# Patient Record
Sex: Female | Born: 1935 | Race: Black or African American | Hispanic: No | Smoking: Never smoker
Health system: Southern US, Community
[De-identification: ages and names within clinical notes are randomized; demographics above are authoritative.]

## PROBLEM LIST (undated history)

## (undated) DIAGNOSIS — E785 Hyperlipidemia, unspecified: Secondary | ICD-10-CM

## (undated) DIAGNOSIS — E089 Diabetes mellitus due to underlying condition without complications: Secondary | ICD-10-CM

## (undated) DIAGNOSIS — E46 Unspecified protein-calorie malnutrition: Secondary | ICD-10-CM

## (undated) DIAGNOSIS — N189 Chronic kidney disease, unspecified: Secondary | ICD-10-CM

## (undated) DIAGNOSIS — I10 Essential (primary) hypertension: Secondary | ICD-10-CM

## (undated) DIAGNOSIS — T7840XA Allergy, unspecified, initial encounter: Secondary | ICD-10-CM

## (undated) DIAGNOSIS — R630 Anorexia: Secondary | ICD-10-CM

## (undated) DIAGNOSIS — M810 Age-related osteoporosis without current pathological fracture: Secondary | ICD-10-CM

## (undated) DIAGNOSIS — N2889 Other specified disorders of kidney and ureter: Secondary | ICD-10-CM

## (undated) DIAGNOSIS — F0390 Unspecified dementia without behavioral disturbance: Secondary | ICD-10-CM

## (undated) DIAGNOSIS — D649 Anemia, unspecified: Secondary | ICD-10-CM

## (undated) DIAGNOSIS — R413 Other amnesia: Secondary | ICD-10-CM

## (undated) HISTORY — DX: Hyperlipidemia, unspecified: E78.5

## (undated) HISTORY — DX: Essential (primary) hypertension: I10

## (undated) HISTORY — PX: HIP FRACTURE SURGERY: SHX118

## (undated) HISTORY — PX: WRIST SURGERY: SHX841

## (undated) HISTORY — DX: Other specified disorders of kidney and ureter: N28.89

## (undated) HISTORY — DX: Anemia, unspecified: D64.9

## (undated) HISTORY — DX: Unspecified protein-calorie malnutrition: E46

## (undated) HISTORY — PX: OTHER SURGICAL HISTORY: SHX169

## (undated) HISTORY — DX: Other amnesia: R41.3

## (undated) HISTORY — DX: Age-related osteoporosis without current pathological fracture: M81.0

## (undated) HISTORY — DX: Unspecified dementia, unspecified severity, without behavioral disturbance, psychotic disturbance, mood disturbance, and anxiety: F03.90

## (undated) HISTORY — PX: ABDOMINAL HYSTERECTOMY: SHX81

## (undated) HISTORY — DX: Chronic kidney disease, unspecified: N18.9

## (undated) HISTORY — DX: Allergy, unspecified, initial encounter: T78.40XA

## (undated) HISTORY — DX: Anorexia: R63.0

## (undated) HISTORY — DX: Diabetes mellitus due to underlying condition without complications: E08.9

---

## 2004-11-02 ENCOUNTER — Ambulatory Visit: Payer: Self-pay | Admitting: Dermatology

## 2005-12-01 ENCOUNTER — Ambulatory Visit: Payer: Self-pay | Admitting: Otolaryngology

## 2007-01-22 LAB — HM PAP SMEAR: HM Pap smear: NORMAL

## 2007-02-01 ENCOUNTER — Encounter: Payer: Self-pay | Admitting: Cardiovascular Disease

## 2007-03-08 ENCOUNTER — Ambulatory Visit: Payer: Self-pay | Admitting: Family Medicine

## 2008-05-21 ENCOUNTER — Ambulatory Visit: Payer: Self-pay | Admitting: Family Medicine

## 2009-06-22 ENCOUNTER — Encounter: Payer: Self-pay | Admitting: Cardiovascular Disease

## 2009-06-22 LAB — HM DEXA SCAN

## 2009-06-22 LAB — CONVERTED CEMR LAB
Albumin: 4.8 g/dL
BUN: 17 mg/dL
Chloride: 96 meq/L
Cholesterol: 214 mg/dL
Creatinine, Ser: 1.59 mg/dL
GFR calc Af Amer: 39 mL/min
GFR calc non Af Amer: 32 mL/min
Glucose, Bld: 96 mg/dL
HCT: 29.9 %
Hemoglobin: 13.1 g/dL
Lymphocytes, automated: 42 %
MCV: 91 fL
Platelets: 261 10*3/uL
Potassium: 4.3 meq/L
Total Protein: 7.8 g/dL

## 2009-06-30 ENCOUNTER — Encounter: Payer: Self-pay | Admitting: Cardiovascular Disease

## 2009-06-30 DIAGNOSIS — M1990 Unspecified osteoarthritis, unspecified site: Secondary | ICD-10-CM | POA: Insufficient documentation

## 2009-06-30 DIAGNOSIS — M810 Age-related osteoporosis without current pathological fracture: Secondary | ICD-10-CM | POA: Insufficient documentation

## 2009-06-30 DIAGNOSIS — M79609 Pain in unspecified limb: Secondary | ICD-10-CM

## 2009-07-15 ENCOUNTER — Ambulatory Visit: Payer: Self-pay | Admitting: Family Medicine

## 2009-07-16 ENCOUNTER — Telehealth: Payer: Self-pay | Admitting: Cardiovascular Disease

## 2009-07-20 ENCOUNTER — Ambulatory Visit: Payer: Self-pay | Admitting: Family Medicine

## 2010-01-06 ENCOUNTER — Ambulatory Visit: Payer: Self-pay | Admitting: General Surgery

## 2010-06-22 NOTE — Progress Notes (Signed)
Summary: appointment with tg  Phone Note Outgoing Call   Call placed by: Marchelle Folks Call placed to: Patient Summary of Call: called pt to set up follow up appointment, pt stated that she is not having any problems with her heart. didn't set up appointment. KL:) Initial call taken by: Mercer Pod,  July 16, 2009 7:33 AM

## 2010-06-22 NOTE — Progress Notes (Signed)
Summary: [facility]  [facility]   Imported By: Harlon Flor 07/15/2009 12:05:38  _____________________________________________________________________  External Attachment:    Type:   Image     Comment:   External Document

## 2010-07-19 ENCOUNTER — Ambulatory Visit: Payer: Self-pay | Admitting: General Surgery

## 2010-09-27 ENCOUNTER — Ambulatory Visit: Payer: Self-pay | Admitting: Family Medicine

## 2011-09-15 ENCOUNTER — Emergency Department: Payer: Self-pay | Admitting: Emergency Medicine

## 2011-09-15 LAB — CBC
HCT: 35.4 % (ref 35.0–47.0)
MCHC: 32.5 g/dL (ref 32.0–36.0)
MCV: 93 fL (ref 80–100)
Platelet: 285 10*3/uL (ref 150–440)
RBC: 3.81 10*6/uL (ref 3.80–5.20)
RDW: 12.9 % (ref 11.5–14.5)
WBC: 13.7 10*3/uL — ABNORMAL HIGH (ref 3.6–11.0)

## 2011-09-15 LAB — COMPREHENSIVE METABOLIC PANEL
Alkaline Phosphatase: 93 U/L (ref 50–136)
Anion Gap: 10 (ref 7–16)
Bilirubin,Total: 0.3 mg/dL (ref 0.2–1.0)
EGFR (Non-African Amer.): 41 — ABNORMAL LOW
Glucose: 134 mg/dL — ABNORMAL HIGH (ref 65–99)
SGOT(AST): 23 U/L (ref 15–37)
Sodium: 138 mmol/L (ref 136–145)
Total Protein: 8.1 g/dL (ref 6.4–8.2)

## 2011-09-16 LAB — URINALYSIS, COMPLETE
Bilirubin,UR: NEGATIVE
Glucose,UR: NEGATIVE mg/dL (ref 0–75)
Hyaline Cast: 4
Nitrite: NEGATIVE
Ph: 7 (ref 4.5–8.0)
RBC,UR: 10 /HPF (ref 0–5)
Specific Gravity: 1.011 (ref 1.003–1.030)
Squamous Epithelial: 2

## 2011-09-16 LAB — TROPONIN I: Troponin-I: 0.03 ng/mL

## 2012-11-21 ENCOUNTER — Encounter: Payer: Self-pay | Admitting: Nurse Practitioner

## 2012-11-22 ENCOUNTER — Encounter: Payer: Self-pay | Admitting: Nurse Practitioner

## 2012-11-22 ENCOUNTER — Ambulatory Visit (INDEPENDENT_AMBULATORY_CARE_PROVIDER_SITE_OTHER): Payer: Medicare PPO | Admitting: Nurse Practitioner

## 2012-11-22 VITALS — BP 150/80 | HR 68 | Ht 60.0 in | Wt 120.0 lb

## 2012-11-22 DIAGNOSIS — F0281 Dementia in other diseases classified elsewhere with behavioral disturbance: Secondary | ICD-10-CM

## 2012-11-22 DIAGNOSIS — F079 Unspecified personality and behavioral disorder due to known physiological condition: Secondary | ICD-10-CM

## 2012-11-22 MED ORDER — DONEPEZIL HCL 10 MG PO TABS: 10.0000 mg | ORAL_TABLET | Freq: Every day | ORAL | Status: DC

## 2012-11-22 MED ORDER — MEMANTINE HCL ER 28 MG PO CP24: 28.0000 mg | ORAL_CAPSULE | Freq: Every day | ORAL | Status: DC

## 2012-11-22 NOTE — Progress Notes (Signed)
HPI: Ms  Leugers is a 78 year old right-handed black female with a history of a memory disturbance. The patient lives alone, but the family helps her day to day and she has a paid caregiver 2 hours daily. The patient still operates a motor vehicle, driving short distances to church or to the grocery store to get a few items. The patient is monitored on a regular basis by the family. The patient has the pills placed into a pill dispenser, and the patient still cooks for herself. The family indicates that there have been no safety issues with this. The patient has her finances done by other family members. The patient is otherwise performing all of her activities of daily living on her own. The patient is on Aricept, and she is tolerating the medication. She is also taking Namenda without side effects No other new medical issues have come up since last seen. According to the daughter who is with her today her condition is stable.     ROS:  Memory loss  Physical Exam General: well developed, well nourished, seated, in no evident distress Head: head normocephalic and atraumatic. Oropharynx benign Neck: supple with no carotid  bruits Cardiovascular: regular rate and rhythm, no murmurs  Neurologic Exam Mental Status: Awake and  alert. MMSE 21/30 missing items in orientation, concentration, 2 of 3 recall and inability to copy a figure. AFT 5.  Follows all commands. Speech and language normal.   Cranial Nerves: Pupils equal, briskly reactive to light. Extraocular movements full without nystagmus. Visual fields full to confrontation. Hearing intact and symmetric to finger snap. Facial sensation intact. Face, tongue, palate move normally and symmetrically. Neck flexion and extension normal.  Motor: Normal bulk and tone. Normal strength in all tested extremity muscles.No focal weakness Sensory.: intact to touch and pinprick and vibratory.  Coordination: Rapid alternating movements normal in all extremities.  Finger-to-nose and heel-to-shin performed accurately bilaterally. No dysmetria Gait and Station: Arises from chair without difficulty. Stance is normal.  Able to heel, toe and tandem walk without difficulty.  Reflexes: 2+ and symmetric. Toes downgoing.     ASSESSMENT: Dementia currently on Aricept and Namenda with stable Mini-Mental Status exam score     PLAN: Pt to continue aricept will call in refills Patient to continue Namenda will call in refills Followup in 6 months Nilda Riggs, GNP-BC APRN

## 2012-11-22 NOTE — Patient Instructions (Addendum)
Pt to continue aricept will call in refills Patient to continue Namenda will call in refills Memory score is stable Followup in 6 months

## 2012-11-22 NOTE — Progress Notes (Signed)
I have read the note, and I agree with the clinical assessment and plan.  

## 2013-04-11 ENCOUNTER — Emergency Department: Payer: Self-pay | Admitting: Emergency Medicine

## 2013-04-11 LAB — URINALYSIS, COMPLETE
Blood: NEGATIVE
Glucose,UR: NEGATIVE mg/dL (ref 0–75)
Hyaline Cast: 3
Nitrite: NEGATIVE
Ph: 5 (ref 4.5–8.0)
Protein: NEGATIVE
Specific Gravity: 1.01 (ref 1.003–1.030)
Squamous Epithelial: 1
WBC UR: 5 /HPF (ref 0–5)

## 2013-04-11 LAB — CBC WITH DIFFERENTIAL/PLATELET
Basophil %: 0.9 %
Eosinophil #: 0 10*3/uL (ref 0.0–0.7)
HCT: 38.4 % (ref 35.0–47.0)
Lymphocyte %: 31.3 %
MCH: 30.5 pg (ref 26.0–34.0)
MCHC: 33.7 g/dL (ref 32.0–36.0)
Monocyte %: 8.7 %
Neutrophil #: 4.4 10*3/uL (ref 1.4–6.5)
Neutrophil %: 58.5 %

## 2013-04-11 LAB — BASIC METABOLIC PANEL
Anion Gap: 7 (ref 7–16)
Co2: 28 mmol/L (ref 21–32)
Creatinine: 2.07 mg/dL — ABNORMAL HIGH (ref 0.60–1.30)
Potassium: 4.2 mmol/L (ref 3.5–5.1)
Sodium: 135 mmol/L — ABNORMAL LOW (ref 136–145)

## 2013-05-27 ENCOUNTER — Ambulatory Visit: Payer: Medicare PPO | Admitting: Nurse Practitioner

## 2013-10-21 ENCOUNTER — Other Ambulatory Visit: Payer: Self-pay | Admitting: Nurse Practitioner

## 2013-11-28 ENCOUNTER — Ambulatory Visit: Payer: Medicare PPO | Admitting: Nurse Practitioner

## 2013-11-28 ENCOUNTER — Telehealth: Payer: Self-pay | Admitting: Nurse Practitioner

## 2013-11-28 NOTE — Telephone Encounter (Signed)
No show for scheduled appt 

## 2013-12-05 LAB — LIPID PANEL
Cholesterol: 201 mg/dL — AB (ref 0–200)
HDL: 76 mg/dL — AB (ref 35–70)
LDL CALC: 110 mg/dL
TRIGLYCERIDES: 77 mg/dL (ref 40–160)

## 2014-01-22 ENCOUNTER — Ambulatory Visit (INDEPENDENT_AMBULATORY_CARE_PROVIDER_SITE_OTHER): Payer: Medicare PPO | Admitting: Nurse Practitioner

## 2014-01-22 ENCOUNTER — Encounter: Payer: Self-pay | Admitting: Nurse Practitioner

## 2014-01-22 VITALS — BP 146/82 | HR 59 | Ht 60.0 in | Wt 116.0 lb

## 2014-01-22 DIAGNOSIS — F079 Unspecified personality and behavioral disorder due to known physiological condition: Secondary | ICD-10-CM

## 2014-01-22 DIAGNOSIS — F0281 Dementia in other diseases classified elsewhere with behavioral disturbance: Secondary | ICD-10-CM

## 2014-01-22 DIAGNOSIS — F02818 Dementia in other diseases classified elsewhere, unspecified severity, with other behavioral disturbance: Secondary | ICD-10-CM

## 2014-01-22 MED ORDER — DONEPEZIL HCL 10 MG PO TABS: 10.0000 mg | ORAL_TABLET | Freq: Every day | ORAL | Status: DC

## 2014-01-22 MED ORDER — MEMANTINE HCL ER 28 MG PO CP24: 28.0000 mg | ORAL_CAPSULE | Freq: Every day | ORAL | Status: DC

## 2014-01-22 NOTE — Progress Notes (Signed)
I have read the note, and I agree with the clinical assessment and plan.  WILLIS,CHARLES KEITH   

## 2014-01-22 NOTE — Patient Instructions (Signed)
Continued Aricept will refill Continue Namenda will refill Followup in 6 months

## 2014-01-22 NOTE — Progress Notes (Signed)
[facility]  PATIENT: Tracy Sawyer DOB: Jun 07, 1934   REASON FOR VISIT: Followup for memory loss   HISTORY OF PRESENT ILLNESS:Ms Ganesh is a 79 year old right-handed black female with a history of a memory disturbance. The patient lives alone, but the family helps her day to day and she has a paid caregiver 2 hours daily. The patient does not  operate a motor vehicle.The patient is monitored on a regular basis by the family. The patient has the pills placed into a pill dispenser, and the patient still cooks for herself by warming things in the microwave.  The family indicates that there have been no safety issues with this. The patient has her finances done by other family members. The patient is otherwise performing all of her activities of daily living on her own. The patient is on Aricept, and she is tolerating the medication. She is also taking Namenda without side effects No other new medical issues have come up since last seen. According to the daughter who is with her today her condition is stable. She returns for reevaluation. She needs refills on her medications.  REVIEW OF SYSTEMS: Full 14 system review of systems performed and notable only for those listed, all others are neg:  Constitutional: N/A  Cardiovascular: N/A  Ear/Nose/Throat: N/A  Skin: N/A  Eyes: N/A  Respiratory: Cough Gastroitestinal: N/A  Hematology/Lymphatic: N/A  Endocrine: N/A Musculoskeletal:N/A  Allergy/Immunology: N/A  Neurological: Memory loss  Psychiatric: N/A Sleep : NA   ALLERGIES: Allergies  Allergen Reactions  . Aspirin Adult Low [Aspirin]   . Fosamax [Alendronate Sodium]   . Penicillins     HOME MEDICATIONS: Outpatient Prescriptions Prior to Visit  Medication Sig Dispense Refill  . amLODipine-benazepril (LOTREL) 5-20 MG per capsule Take 1 capsule by mouth daily.      Marland Kitchen donepezil (ARICEPT) 10 MG tablet TAKE 1 TABLET BY MOUTH EVERY DAY  30 tablet  0  . Memantine  HCl ER (NAMENDA XR) 28 MG CP24 Take 28 mg by mouth daily.  30 capsule  6  . Multiple Vitamin (MULTIVITAMIN) tablet Take 1 tablet by mouth daily.       No facility-administered medications prior to visit.    PAST MEDICAL HISTORY: Past Medical History  Diagnosis Date  . Dementia   . Hypertension   . Memory loss     PAST SURGICAL HISTORY: Past Surgical History  Procedure Laterality Date  . Cataract surgery Right   . Vocal cord nodule resection    . Wrist surgery    . Ganglion cyst resection Left     FAMILY HISTORY: Family History  Problem Relation Age of Onset  . Dementia Mother   . Stroke Mother   . Diabetes Father   . Peripheral vascular disease Father   . Cancer      1 sibling  . Cancer      1 sibling    SOCIAL HISTORY: History   Social History  . Marital Status: Unknown    Spouse Name: N/A    Number of Children: 2  . Years of Education: N/A   Occupational History  . Retired     Social History Main Topics  . Smoking status: Never Smoker   . Smokeless tobacco: Never Used  . Alcohol Use: No  . Drug Use: No  . Sexual Activity: Not on file   Other Topics Concern  . Not on file   Social History Narrative   Patient is widowed.   Patient lives  alone.   Patient is retired from working in [employer] school system.    Patient has a high school education.    Patient has 2 children.   Patient has 11 brothers and 6 sisters. 2 siblings passed away of cancer.    Several siblings have diabetes and multiple siblings had hypertension and peripheral vascular disease runs in the family.            PHYSICAL EXAM  Filed Vitals:   01/22/14 1351  BP: 146/82  Pulse: 59  Height: 5' (1.524 m)  Weight: 116 lb (52.617 kg)   Body mass index is 22.65 kg/(m^2). General: well developed, well nourished, seated, in no evident distress  Head: head normocephalic and atraumatic. Oropharynx benign  Neck: supple with no carotid bruits  Cardiovascular: regular rate and  rhythm, no murmurs  Neurologic Exam  Mental Status: Awake and alert. MMSE 16/30 missing items in orientation, concentration, 3 of 3 recall and inability to copy a figure. AFT 4. Follows all commands. Speech and language normal.  Cranial Nerves: Pupils equal, briskly reactive to light. Extraocular movements full without nystagmus. Visual fields full to confrontation. Hearing intact and symmetric to finger snap. Facial sensation intact. Face, tongue, palate move normally and symmetrically. Neck flexion and extension normal.  Motor: Normal bulk and tone. Normal strength in all tested extremity muscles.No focal weakness  Sensory.: intact to touch and pinprick and vibratory.  Coordination: Rapid alternating movements normal in all extremities. Finger-to-nose and heel-to-shin performed accurately bilaterally. No dysmetria  Gait and Station: Arises from chair without difficulty. Stance is normal. Able to heel, toe and tandem walk without difficulty.  Reflexes: 2+ and symmetric. Toes downgoing.     DIAGNOSTIC DATA (LABS, IMAGING, TESTING) - ASSESSMENT AND PLAN  79 y.o. year old female  has a past medical history of Dementia; Hypertension; and Memory loss. here in followup.  Continued Aricept will refill Continue Namenda will refill Followup in 6 months Nilda Riggs, [facility], [facility], APRN  [facility] [address] [place], [place] [address] [phone]

## 2014-04-09 ENCOUNTER — Encounter: Payer: Self-pay | Admitting: Neurology

## 2014-04-15 ENCOUNTER — Encounter: Payer: Self-pay | Admitting: Neurology

## 2014-05-18 LAB — COMPREHENSIVE METABOLIC PANEL
AST: 35 U/L (ref 15–37)
Albumin: 3.8 g/dL (ref 3.4–5.0)
Alkaline Phosphatase: 73 U/L
Anion Gap: 8 (ref 7–16)
BILIRUBIN TOTAL: 0.4 mg/dL (ref 0.2–1.0)
BUN: 39 mg/dL — ABNORMAL HIGH (ref 7–18)
CO2: 28 mmol/L (ref 21–32)
Calcium, Total: 9 mg/dL (ref 8.5–10.1)
Chloride: 105 mmol/L (ref 98–107)
Creatinine: 1.92 mg/dL — ABNORMAL HIGH (ref 0.60–1.30)
EGFR (African American): 33 — ABNORMAL LOW
GFR CALC NON AF AMER: 27 — AB
Glucose: 100 mg/dL — ABNORMAL HIGH (ref 65–99)
OSMOLALITY: 291 (ref 275–301)
Potassium: 3.9 mmol/L (ref 3.5–5.1)
SGPT (ALT): 40 U/L
Sodium: 141 mmol/L (ref 136–145)
Total Protein: 7.1 g/dL (ref 6.4–8.2)

## 2014-05-18 LAB — CBC
HCT: 31.9 % — AB (ref 35.0–47.0)
HGB: 10.4 g/dL — ABNORMAL LOW (ref 12.0–16.0)
MCH: 30.5 pg (ref 26.0–34.0)
MCHC: 32.5 g/dL (ref 32.0–36.0)
MCV: 94 fL (ref 80–100)
Platelet: 314 10*3/uL (ref 150–440)
RBC: 3.41 10*6/uL — ABNORMAL LOW (ref 3.80–5.20)
RDW: 12.7 % (ref 11.5–14.5)
WBC: 16.8 10*3/uL — ABNORMAL HIGH (ref 3.6–11.0)

## 2014-05-19 ENCOUNTER — Inpatient Hospital Stay: Payer: Self-pay | Admitting: Internal Medicine

## 2014-05-19 DIAGNOSIS — S72009A Fracture of unspecified part of neck of unspecified femur, initial encounter for closed fracture: Secondary | ICD-10-CM | POA: Insufficient documentation

## 2014-05-19 LAB — URINALYSIS, COMPLETE
BACTERIA: NONE SEEN
BILIRUBIN, UR: NEGATIVE
BLOOD: NEGATIVE
Glucose,UR: NEGATIVE mg/dL (ref 0–75)
KETONE: NEGATIVE
Leukocyte Esterase: NEGATIVE
Nitrite: NEGATIVE
Ph: 6 (ref 4.5–8.0)
Protein: NEGATIVE
RBC,UR: 10 /HPF (ref 0–5)
Specific Gravity: 1.015 (ref 1.003–1.030)
Squamous Epithelial: <1
WBC UR: 2 /HPF (ref 0–5)

## 2014-05-20 DIAGNOSIS — Z01810 Encounter for preprocedural cardiovascular examination: Secondary | ICD-10-CM

## 2014-05-20 DIAGNOSIS — I10 Essential (primary) hypertension: Secondary | ICD-10-CM

## 2014-05-20 LAB — COMPREHENSIVE METABOLIC PANEL
ALT: 25 U/L
Albumin: 3 g/dL — ABNORMAL LOW (ref 3.4–5.0)
Alkaline Phosphatase: 62 U/L
Anion Gap: 9 (ref 7–16)
BUN: 23 mg/dL — ABNORMAL HIGH (ref 7–18)
Bilirubin,Total: 0.5 mg/dL (ref 0.2–1.0)
CREATININE: 1.84 mg/dL — AB (ref 0.60–1.30)
Calcium, Total: 8.1 mg/dL — ABNORMAL LOW (ref 8.5–10.1)
Chloride: 101 mmol/L (ref 98–107)
Co2: 25 mmol/L (ref 21–32)
EGFR (African American): 34 — ABNORMAL LOW
EGFR (Non-African Amer.): 28 — ABNORMAL LOW
Glucose: 156 mg/dL — ABNORMAL HIGH (ref 65–99)
OSMOLALITY: 277 (ref 275–301)
Potassium: 3.8 mmol/L (ref 3.5–5.1)
SGOT(AST): 19 U/L (ref 15–37)
Sodium: 135 mmol/L — ABNORMAL LOW (ref 136–145)
TOTAL PROTEIN: 6.1 g/dL — AB (ref 6.4–8.2)

## 2014-05-20 LAB — BASIC METABOLIC PANEL
Anion Gap: 6 — ABNORMAL LOW (ref 7–16)
BUN: 22 mg/dL — ABNORMAL HIGH (ref 7–18)
CHLORIDE: 104 mmol/L (ref 98–107)
Calcium, Total: 8.7 mg/dL (ref 8.5–10.1)
Co2: 27 mmol/L (ref 21–32)
Creatinine: 1.6 mg/dL — ABNORMAL HIGH (ref 0.60–1.30)
GFR CALC AF AMER: 40 — AB
GFR CALC NON AF AMER: 33 — AB
Glucose: 100 mg/dL — ABNORMAL HIGH (ref 65–99)
Osmolality: 277 (ref 275–301)
POTASSIUM: 4.1 mmol/L (ref 3.5–5.1)
SODIUM: 137 mmol/L (ref 136–145)

## 2014-05-20 LAB — CBC WITH DIFFERENTIAL/PLATELET
BASOS ABS: 0 10*3/uL (ref 0.0–0.1)
BASOS PCT: 0.5 %
Basophil #: 0 10*3/uL (ref 0.0–0.1)
Basophil %: 0.6 %
EOS ABS: 0.1 10*3/uL (ref 0.0–0.7)
Eosinophil #: 0.1 10*3/uL (ref 0.0–0.7)
Eosinophil %: 0.8 %
Eosinophil %: 1.1 %
HCT: 29.6 % — ABNORMAL LOW (ref 35.0–47.0)
HCT: 29 % — ABNORMAL LOW (ref 35.0–47.0)
HGB: 9.6 g/dL — ABNORMAL LOW (ref 12.0–16.0)
HGB: 9.5 g/dL — ABNORMAL LOW (ref 12.0–16.0)
LYMPHS ABS: 2.2 10*3/uL (ref 1.0–3.6)
LYMPHS PCT: 15.5 %
LYMPHS PCT: 27.1 %
Lymphocyte #: 1.4 10*3/uL (ref 1.0–3.6)
MCH: 30.7 pg (ref 26.0–34.0)
MCH: 30.6 pg (ref 26.0–34.0)
MCHC: 32.5 g/dL (ref 32.0–36.0)
MCHC: 32.8 g/dL (ref 32.0–36.0)
MCV: 95 fL (ref 80–100)
MCV: 93 fL (ref 80–100)
MONO ABS: 0.9 x10 3/mm (ref 0.2–0.9)
Monocyte #: 1 x10 3/mm — ABNORMAL HIGH (ref 0.2–0.9)
Monocyte %: 9.8 %
Monocyte %: 12.4 %
NEUTROS ABS: 6.8 10*3/uL — AB (ref 1.4–6.5)
NEUTROS ABS: 4.9 10*3/uL (ref 1.4–6.5)
NEUTROS PCT: 73.4 %
NEUTROS PCT: 58.8 %
Platelet: 219 10*3/uL (ref 150–440)
Platelet: 202 10*3/uL (ref 150–440)
RBC: 3.14 10*6/uL — ABNORMAL LOW (ref 3.80–5.20)
RBC: 3.11 10*6/uL — ABNORMAL LOW (ref 3.80–5.20)
RDW: 12.7 % (ref 11.5–14.5)
RDW: 12.8 % (ref 11.5–14.5)
WBC: 9.2 10*3/uL (ref 3.6–11.0)
WBC: 8.3 10*3/uL (ref 3.6–11.0)

## 2014-05-21 ENCOUNTER — Encounter: Payer: Self-pay | Admitting: Internal Medicine

## 2014-05-21 LAB — BASIC METABOLIC PANEL
ANION GAP: 8 (ref 7–16)
BUN: 17 mg/dL (ref 7–18)
CALCIUM: 8.8 mg/dL (ref 8.5–10.1)
Chloride: 105 mmol/L (ref 98–107)
Co2: 28 mmol/L (ref 21–32)
Creatinine: 1.49 mg/dL — ABNORMAL HIGH (ref 0.60–1.30)
EGFR (African American): 44 — ABNORMAL LOW
EGFR (Non-African Amer.): 36 — ABNORMAL LOW
Glucose: 91 mg/dL (ref 65–99)
Osmolality: 282 (ref 275–301)
Potassium: 3.9 mmol/L (ref 3.5–5.1)
Sodium: 141 mmol/L (ref 136–145)

## 2014-05-21 LAB — HEMOGLOBIN: HGB: 10.4 g/dL — ABNORMAL LOW (ref 12.0–16.0)

## 2014-05-22 LAB — CBC WITH DIFFERENTIAL/PLATELET
BASOS ABS: 0.1 10*3/uL (ref 0.0–0.1)
Basophil %: 0.5 %
Eosinophil #: 0.1 10*3/uL (ref 0.0–0.7)
Eosinophil %: 1 %
HCT: 29.3 % — ABNORMAL LOW (ref 35.0–47.0)
HGB: 9.6 g/dL — ABNORMAL LOW (ref 12.0–16.0)
LYMPHS ABS: 1.5 10*3/uL (ref 1.0–3.6)
Lymphocyte %: 14.7 %
MCH: 30.6 pg (ref 26.0–34.0)
MCHC: 32.9 g/dL (ref 32.0–36.0)
MCV: 93 fL (ref 80–100)
Monocyte #: 1.1 x10 3/mm — ABNORMAL HIGH (ref 0.2–0.9)
Monocyte %: 11.5 %
NEUTROS ABS: 7.2 10*3/uL — AB (ref 1.4–6.5)
NEUTROS PCT: 72.3 %
Platelet: 223 10*3/uL (ref 150–440)
RBC: 3.14 10*6/uL — ABNORMAL LOW (ref 3.80–5.20)
RDW: 12.6 % (ref 11.5–14.5)
WBC: 10 10*3/uL (ref 3.6–11.0)

## 2014-05-22 LAB — BASIC METABOLIC PANEL
ANION GAP: 7 (ref 7–16)
BUN: 15 mg/dL (ref 7–18)
CALCIUM: 8.4 mg/dL — AB (ref 8.5–10.1)
CREATININE: 1.66 mg/dL — AB (ref 0.60–1.30)
Chloride: 104 mmol/L (ref 98–107)
Co2: 27 mmol/L (ref 21–32)
EGFR (African American): 39 — ABNORMAL LOW
GFR CALC NON AF AMER: 32 — AB
GLUCOSE: 80 mg/dL (ref 65–99)
Osmolality: 275 (ref 275–301)
POTASSIUM: 3.8 mmol/L (ref 3.5–5.1)
SODIUM: 138 mmol/L (ref 136–145)

## 2014-05-23 ENCOUNTER — Encounter: Payer: Self-pay | Admitting: Internal Medicine

## 2014-07-23 ENCOUNTER — Ambulatory Visit (INDEPENDENT_AMBULATORY_CARE_PROVIDER_SITE_OTHER): Payer: Medicare PPO | Admitting: Nurse Practitioner

## 2014-07-23 ENCOUNTER — Encounter: Payer: Self-pay | Admitting: Nurse Practitioner

## 2014-07-23 VITALS — BP 133/82 | HR 83 | Ht 61.0 in | Wt 102.6 lb

## 2014-07-23 DIAGNOSIS — F0281 Dementia in other diseases classified elsewhere with behavioral disturbance: Secondary | ICD-10-CM

## 2014-07-23 DIAGNOSIS — F02818 Dementia in other diseases classified elsewhere, unspecified severity, with other behavioral disturbance: Secondary | ICD-10-CM

## 2014-07-23 DIAGNOSIS — R413 Other amnesia: Secondary | ICD-10-CM | POA: Diagnosis not present

## 2014-07-23 DIAGNOSIS — F079 Unspecified personality and behavioral disorder due to known physiological condition: Secondary | ICD-10-CM

## 2014-07-23 MED ORDER — MEMANTINE HCL ER 28 MG PO CP24: 28.0000 mg | ORAL_CAPSULE | Freq: Every day | ORAL | Status: DC

## 2014-07-23 MED ORDER — DONEPEZIL HCL 10 MG PO TABS: 10.0000 mg | ORAL_TABLET | Freq: Every day | ORAL | Status: DC

## 2014-07-23 NOTE — Progress Notes (Signed)
I have read the note, and I agree with the clinical assessment and plan.  WILLIS,CHARLES KEITH   

## 2014-07-23 NOTE — Patient Instructions (Signed)
Continue Aricept at current dose will refill Continue Namenda at current dose will refill F/U in 6 months next appt with Dr Anne HahnWillis

## 2014-07-23 NOTE — Progress Notes (Signed)
[facility]  PATIENT: Tracy Sawyer DOB: 04/09/1935   REASON FOR VISIT: follow up for dementia HISTORY FROM:daughter and son in law    HISTORY OF PRESENT ILLNESS:Tracy Sawyer is a 80 year old right-handed black female with a history of a memory disturbance. The patient lives alone, but the family helps her day to day and she has a paid caregiver 10 hours daily. The patient does not operate a motor vehicle.She fell at home in December 2015 and fractured left hip and right pelvis. She spent some time in rehab. Daughter states she has been home from rehabilitation, she no longer wanders at night but sleeps well. She has lost about 20 pounds since last seen. The patient has the pills placed into a pill dispenser. She no longer cooks. The family indicates that there have been no safety issues identified . The patient has her finances done by other family members. The patient is requiring assistance with  activities of daily living. The patient is on Aricept, and she is tolerating the medication. She is also taking Namenda without side effects No other new medical issues have come up since last seen. According to the daughter who is with her today her condition is stable. She returns for reevaluation. She needs refills on her medications.   REVIEW OF SYSTEMS: Full 14 system review of systems performed and notable only for those listed, all others are neg:  Constitutional: neg  Cardiovascular: neg Ear/Nose/Throat: neg  Skin: neg Eyes: neg Respiratory: Cough  Gastroitestinal: neg  Hematology/Lymphatic: neg  Endocrine: neg Musculoskeletal: Walking difficulty Allergy/Immunology: neg Neurological: Memory loss Psychiatric: Confusion  Sleep : neg   ALLERGIES: Allergies  Allergen Reactions  . Aspirin Adult Low [Aspirin]   . Fosamax [Alendronate Sodium]   . Alendronate Other (See Comments)  . Other Other (See Comments)  . Penicillins     HOME  MEDICATIONS: Outpatient Prescriptions Prior to Visit  Medication Sig Dispense Refill  . amLODipine-benazepril (LOTREL) 5-20 MG per capsule Take 1 capsule by mouth daily.    . Multiple Vitamin (MULTIVITAMIN) tablet Take 1 tablet by mouth daily.    Marland Kitchen. donepezil (ARICEPT) 10 MG tablet Take 1 tablet (10 mg total) by mouth daily. 30 tablet 6  . Memantine HCl ER (NAMENDA XR) 28 MG CP24 Take 28 mg by mouth daily. 30 capsule 6   No facility-administered medications prior to visit.    PAST MEDICAL HISTORY: Past Medical History  Diagnosis Date  . Dementia   . Hypertension   . Memory loss     PAST SURGICAL HISTORY: Past Surgical History  Procedure Laterality Date  . Cataract surgery Right   . Vocal cord nodule resection    . Wrist surgery    . Ganglion cyst resection Left   . Hip fracture surgery      FAMILY HISTORY: Family History  Problem Relation Age of Onset  . Dementia Mother   . Stroke Mother   . Diabetes Father   . Peripheral vascular disease Father   . Cancer      1 sibling  . Cancer      1 sibling    SOCIAL HISTORY: History   Social History  . Marital Status: Widowed    Spouse Name: N/A  . Number of Children: 2  . Years of Education: N/A   Occupational History  . Retired     Social History Main Topics  . Smoking status: Never Smoker   . Smokeless tobacco: Never Used  . Alcohol  Use: No  . Drug Use: No  . Sexual Activity: Not on file   Other Topics Concern  . Not on file   Social History Narrative   Patient is widowed.   Patient lives alone.   Patient is retired from working in [employer] school system.    Patient has a high school education.    Patient has 2 children.   Patient has 11 brothers and 6 sisters. 2 siblings passed away of cancer.    Several siblings have diabetes and multiple siblings had hypertension and peripheral vascular disease runs in the family.            PHYSICAL EXAM  Filed Vitals:   07/23/14 0813  BP: 133/82  Pulse:  83  Height:  (1.549 m)  Weight: 102 lb 9.6 oz (46.539 kg)   Body mass index is 19.4 kg/(m^2). General: frail female  seated, in no evident distress  Head: head normocephalic and atraumatic. Oropharynx benign  Neck: supple with no carotid bruits  Cardiovascular: regular rate and rhythm, no murmurs  Neurologic Exam  Mental Status: Awake and alert. MMSE 9/30. Last was 16/30. She missed items in orientation, concentration, 3 of 3 recall, comprehension and inability to copy a figure. AFT 2. Follows some  commands. Speech and language normal.  Cranial Nerves: Pupils equal, briskly reactive to light. Extraocular movements full without nystagmus. Visual fields full to confrontation. Hearing intact and symmetric to finger snap. Facial sensation intact. Face, tongue, palate move normally and symmetrically. Neck flexion and extension normal.  Motor: Normal bulk and tone. Normal strength in all tested extremity muscles.No focal weakness  Sensory.: intact to touch and pinprick and vibratory.  Coordination: Rapid alternating movements normal in all extremities. Finger-to-nose and heel-to-shin performed with dysmetria noted Gait and Station: Arises from chair without difficulty. Stance is wide based. Gait is mildly unsteady. No assistive device  Reflexes: 2+ and symmetric. Toes downgoing.    DIAGNOSTIC DATA (LABS, IMAGING, TESTING) -  ASSESSMENT AND PLAN  80 y.o. year old female  has a past medical history of Dementia; Hypertension; and Memory loss. here to follow-up. She had a recent fall with fracture and pelvic fracture and spent time in rehabilitation. Her memory loss is stable her daughter however her MMSE has declined since last seen. She has 24/ 7 care provided by either family or paid  Caregivers. No safety issues identified  Continue Aricept at current dose will refill Continue Namenda at current dose will refill Discussed safety measures in the home, night lights, grab bars  etc F/U in 6 months next appt with Dr Anne Hahn Nilda Riggs, [facility], [facility], APRN  [facility] [address] [place], [place] [address] [phone]

## 2014-09-13 NOTE — H&P (Signed)
PATIENT NAME:  Tracy Sawyer, Tracy Sawyer MR#:  [id number] DATE OF BIRTH:  [birth year]  DATE OF ADMISSION:  05/19/2014  REFERRING PHYSICIAN: Cecille AmsterdamJonathan E. Mayford KnifeWilliams, MD  PRIMARY CARE PHYSICIAN: Dennison MascotLemont Morrisey, MD  ADMITTING PHYSICIAN: Crissie FiguresEdavally N. Jakia Kennebrew, MD   CHIEF COMPLAINT: Left lower extremity pain.   HISTORY OF PRESENT ILLNESS: A 80 year old African American female with a past medical history of hypertension, chronic kidney disease stage III, history of dementia, vitamin D deficiency, who presents to the Emergency Room with complaints of left lower extremity pain following a mechanical fall that she sustained last evening. According to the patient's daughter, who is with the patient at this time, stated that she just fell down due to tripping and sustained injury to the left lower extremity, and was not able to get up because of the pain. Hence, EMS was called, who brought the patient to the Emergency Room for further evaluation. The patient stated that she was doing fine and she does not exactly remember what caused her fall, but denies any loss of consciousness. No history of chest pain. No shortness of breath. No dizziness. No recent fevers. No nausea. No vomiting. No diarrhea. No diaphoresis. In the Emergency Room, the patient was evaluated by the ED physician and was found to be with stable vitals. X-ray of the left hip and the pelvis revealed a fracture of the left femur neck also fracture of the right superior pubic ramus, which was also confirmed by the CT of the pelvis which was obtained in the Emergency Room. Orthopedic surgeon on-call was consulted by the ED physician and according orthopedic, the patient was advised admission to the medical floor with possible surgery in the morning, to be followed up by orthopedic surgeon. She received some pain medications, following which her pain is under control and denies any complaints at this time. She is comfortably resting in the bed at this time.   PAST  MEDICAL HISTORY:  1.  Hypertension.  2.  Chronic kidney disease stage III.  3.  Dementia.  4.  Vitamin D deficiency.   PAST SURGICAL HISTORY: Hysterectomy.   ALLERGIES: PENICILLIN.   HOME MEDICATIONS:  1.  Aricept 10 mg tablet 1 tablet orally once a day.  2.  Hydrochlorothiazide 12.5 mg tablet 1 tablet orally once a day.  3.  Lotrel 10/40 mg tablet 1 tablet orally once a day.  4.  Mirtazapine 15 mg tablet 1 tablet orally once a day in the evening.  5.  Multivitamin 1 tablet orally once a day.  6.  Namenda XR 28 mg oral capsule extended release 1 capsule orally once a day.  7.  Triamcinolone topical cream apply as needed for rash.  8.  Tylenol Extra Strength 500 mg tablet 1 tablet orally 3 times a day.  9.  Vitamin D3 at 2000 international units oral capsule 1 capsule orally once a day.  10.  Voltaren topical 1% topical gel apply 4 grams to the left shoulder 4 times a day.   FAMILY HISTORY: Significant for hypertension and diabetes mellitus, which runs in family members.   SOCIAL HISTORY: She is single, lives alone. The patient was ambulatory at home without any support. No history of any alcohol usage, substance usage, or smoking.   REVIEW OF SYSTEMS:  CONSTITUTIONAL: Negative for fever, fatigue, or generalized weakness.  EYES: Negative for blurred vision, double vision. No pain. No redness. No inflammation.  EARS, NOSE, AND THROAT: Negative for tinnitus, ear pain, hearing loss, epistaxis, nasal discharge, or  difficulty swallowing.  RESPIRATORY: Negative for cough, wheezing, hemoptysis, dyspnea, painful respirations.  CARDIOVASCULAR: Negative for chest pain, orthopnea, pedal edema, palpitations, dizziness, or loss of consciousness.  GASTROINTESTINAL: Negative for nausea, vomiting, diarrhea, abdominal pain, hematemesis, melena, or GERD symptoms.  GENITOURINARY: Negative for dysuria, hematuria, frequency, or urgency.  ENDOCRINE: Negative for polyuria or nocturia. No heat or cold  intolerance.  HEMATOLOGIC: Negative for anemia, easy bruising, bleeding. INTEGUMENTARY: Negative for acne, skin rash, or lesions.  MUSCULOSKELETAL: Positive for left lower extremity pain following mechanical fall, which happened yesterday. History of chronic right shoulder pain, for which she takes p.r.n. pain  medications.  NEUROLOGICAL: Negative for focal weakness or numbness. No history of CVA, TIA. No history of seizure disorder. She does have a history of dementia, for which she takes medications and she is pretty much able to take care of herself at home.  PSYCHIATRIC: Negative for anxiety, insomnia, or depression.   PHYSICAL EXAMINATION:  VITAL SIGNS: Temperature 97.7 degrees Fahrenheit, pulse rate 90 per minute, respirations 18 per minute, blood pressure 151/98, current blood pressure 139/63, O2 saturation is 97% on room air.  GENERAL: A well-developed, well-nourished, pleasant lady, alert, awake, and oriented, in no acute distress, comfortably lying in the bed.  HEAD: Atraumatic, normocephalic.  EYES: Pupils are equal, react to light and accommodation. No conjunctival pallor. No scleral icterus. Extraocular movements intact.  NOSE: No nasal lesions. No drainage.  EARS: No drainage. No external lesions.  ORAL CAVITY: No mucosal lesions. No exudates.  NECK: Supple. No JVD. No thyromegaly. No carotid bruit. Range of motion of neck normal.  RESPIRATORY: Good respiratory effort. Bilateral vesicular breath sounds. No rales or rhonchi. Not using accessory muscles of respiration.  CARDIOVASCULAR: S1, S2 regular. No murmurs, gallops, or clicks appreciated. Peripheral pulses equal at carotid, femoral, and pedal pulses. No peripheral edema.  GASTROINTESTINAL: Abdomen is soft and nontender. No hepatosplenomegaly. Bowel sounds present and equal in all 4 quadrants. No guarding. No rebound. No rigidity.  GENITOURINARY: Deferred.  MUSCULOSKELETAL: Able to move both lower extremities and upper  extremities, but limited movement of the left lower extremity secondary to the fracture sustained secondary to fall.  SKIN: Inspection is within normal limits.  LYMPHATIC: Negative for cervical lymphadenopathy.  VASCULAR: Good dorsalis pedis and posterior tibial pulses.  NEUROLOGICAL: Alert, awake, and oriented x 3. Cranial nerves II to XII are grossly intact. DTRs 2+ and bilateral symmetrical. Strength 4/4 in both upper and lower extremities.  PSYCHIATRIC: Judgment and insight are adequate. Alert and oriented x 3. History of dementia, stable at this time.   ANCILLARY DATA: Labs: Serum glucose 100, BUN 39, creatinine 1.92, serum sodium 141, potassium 3.9, chloride 105, bicarbonate 28, total calcium 9.0, total protein 7.1, serum albumin 3.8, total bilirubin 0.4, alkaline phosphatase 73, AST 35, ALT 40. WBC 16.8, hemoglobin 10.4, hematocrit 31.9, platelet count 314,000.   X-ray of the left hip:  1.  Cannot exclude a nondisplaced left femoral neck fracture.  2.  Nondisplaced right superior pubic ramus fracture.   CT of the pelvis without contrast:  1.  Nondisplaced subcapital left femoral neck fracture.  2.  Right superior pubic ramus fracture with small extraperitoneal pelvic hematoma.   EKG: Normal sinus rhythm with ventricular rate of 95 beats per minute, nonspecific T wave abnormality, otherwise no acute ST-T changes.   ASSESSMENT AND PLAN: A 80 year old [place] American female with a past medical history significant for hypertension, dementia, chronic kidney disease stage III, vitamin D deficiency, presents to the Emergency  Room with complaints of left lower extremity pain following a mechanical fall and diagnosed to have fracture of the left femur neck and right superior pubic ramus fracture.   1.  Fractured left femur neck, nondisplaced, secondary to mechanical fall. Emergency Department physician consulted orthopedic on-call and advised admission to the medical floor and possible surgery in  a.Sawyer., to be followed by orthopedist on call. Plan: Admit to medical floor. Pain control measures and orthopedics consultation. Physical therapy and occupational therapy consultation requested.  2.  Fracture, right superior pubic ramus secondary to fall. Plan: Admit to medical floor. Pain control measures. Orthopedics consult, physical therapy and occupational therapy consultation.  3.  Hypertension, stable on home medications, continue the same.  4.  Chronic kidney disease, stage III, stable clinically. Continue close monitoring. Avoid nephrotoxic agents. Follow BMP.  5.  Dementia, stable on home medications. The patient is able to take care of herself at home. Continue home medications and supportive care.  6.  Vitamin D deficiency, on vitamin D supplementation, stable clinically. Continue same.  7.  Leukocytosis, likely secondary to stress. No history of any fever. The patient does not look sick or anything. We will monitor for now. Repeat CBC in the morning and follow up accordingly. Follow up temperature curve.  8.  Deep vein thrombosis prophylaxis: Subcutaneous heparin.  9.  Gastrointestinal prophylaxis: Proton pump inhibitor.   CODE STATUS: FULL CODE.   TIME SPENT: 55 minutes.    ____________________________ Crissie Figures, MD enr:ts D: 05/19/2014 01:06:27 ET T: 05/19/2014 02:37:20 ET JOB#: [id number]  cc: Crissie Figures, MD, <Dictator> Dennison Mascot, MD Crissie Figures MD ELECTRONICALLY SIGNED 05/19/2014 19:41

## 2014-09-13 NOTE — Consult Note (Signed)
PATIENT NAME:  Tracy Sawyer, Tracy Sawyer MR#:  [id number] DATE OF BIRTH:  Oct 20, 1934  DATE OF CONSULTATION:  05/19/2014  REFERRING PHYSICIAN:   CONSULTING PHYSICIAN:  Leitha SchullerMichael J. Redding Cloe, MD  REASON FOR CONSULTATION: Left hip fracture.   HISTORY OF PRESENT ILLNESS: The patient is a 80 year old who suffered a fall at home. She had previously been an independent ambulator, using a cane at times. She has been having trouble with confusion in the past and now, after suffering a fall, was found to have an impacted femoral neck fracture on the left, as well as pubic ramus fracture on the right. Again, she had been living at home.   PHYSICAL EXAMINATION: She has some pain with logrolling of the left leg. She has no shortening or external rotation of the leg. She is able to flex and extend the toes, and has a strong dorsalis pedis pulses.   IMAGING: X-rays show an impacted valgus femoral neck fracture.   IMPRESSION: Impacted valgus femoral neck fracture in a household ambulator.   RECOMMENDATION: Open reduction and internal fixation with multiple screws, understanding that there is a chance that this could fail or get avascular necrosis. The risk, benefits, and possible complications were discussed with her family. We will plan on surgery today with multiple pinning.    ____________________________ Leitha SchullerMichael J. Sharlyn Odonnel, MD mjm:MT D: 05/19/2014 15:27:04 ET T: 05/19/2014 16:02:38 ET JOB#: [id number]  cc: Leitha SchullerMichael J. Melicia Esqueda, MD, <Dictator> Leitha SchullerMICHAEL J Linnea Todisco MD ELECTRONICALLY SIGNED 05/19/2014 17:36

## 2014-09-13 NOTE — Consult Note (Signed)
Brief Consult Note: Diagnosis: left impacted femoral neck fracture, right side pubic ramus.   Patient was seen by consultant.   Recommend to proceed with surgery or procedure.   Orders entered.   Comments: plan pinning later today Discussed with family, risks, benefits, possible complications and alternative treatments.  Electronic Signatures: Leitha SchullerMenz, Ladislaus Repsher J (MD)  (Signed 28-Dec-15 08:25)  Authored: Brief Consult Note   Last Updated: 28-Dec-15 08:25 by Leitha SchullerMenz, Renny Gunnarson J (MD)

## 2014-09-13 NOTE — Consult Note (Signed)
PATIENT NAME:  Tracy Sawyer, Glenis M MR#:  [id number] DATE OF BIRTH:  1934/06/06  DATE OF CONSULTATION:  05/19/2014  REFERRING PHYSICIAN:   CONSULTING PHYSICIAN:  Leitha SchullerMichael J. Merna Baldi, MD  REASON FOR CONSULTATION: Left femoral neck fracture.   HISTORY OF PRESENT ILLNESS: The patient is a 80 year old female who suffered a fall at home. She lives at home although she does have problems with confusion and apparently some dementia. Her family helps take care of her. She has been able to ambulate around the home at least without assistive device or problems. She was brought to the Emergency Room after having a fall and having hip pain. X-rays show an impacted valgus femoral neck fracture and she is admitted for treatment of this.   PHYSICAL EXAMINATION: She does not have shortening and external rotation. She has mild pain with logrolling of the leg. X-rays show impacted femoral neck fracture. Additionally, a CT scan also showed a right-sided pubic ramus fracture that will not require treatment.   IMPRESSION: Left femoral neck fracture amenable to hip pinning. Risks, benefits, and possible complications were discussed including potential for blood clot, infection, late avascular necrosis of loss of fixation. Plan on surgery 05/19/2014.  ____________________________ Leitha SchullerMichael J. Shreeya Recendiz, MD mjm:TT D: 05/20/2014 19:53:32 ET T: 05/20/2014 20:33:12 ET JOB#: [id number]  cc: Leitha SchullerMichael J. Sheri Gatchel, MD, <Dictator> Leitha SchullerMICHAEL J Brenley Priore MD ELECTRONICALLY SIGNED 05/21/2014 8:36

## 2014-09-13 NOTE — Op Note (Signed)
PATIENT NAME:  Tracy DexterWILLIAMS, [name] MR#:  [id number] DATE OF BIRTH:  March 22, 1935  DATE OF PROCEDURE:  05/19/2014  PREOPERATIVE DIAGNOSIS: Impacted femoral neck fracture, left hip.   POSTOPERATIVE DIAGNOSIS: Impacted femoral neck fracture, left hip.  PROCEDURE: Left hip pinning.   ANESTHESIA: Initially spinal and then general.   DESCRIPTION OF PROCEDURE: The patient was brought to the operating room and after adequate anesthesia was obtained, the patient was transferred to the fracture table where C-Arm was brought in and good visualization of the fracture could be obtained. There had been no change in alignment of the fracture. After appropriate patient identification and timeout procedures were completed, base with C-arm as guide, a small lateral incision was made over the guidewire inserted. Based on this initial guidewire, 3 additional guidewires were placed going up into the femoral head. These were measured, drilled, tapped and long thread 7.3 mm screws were inserted to the appropriate level. There was no penetration of the screws into the joint. After this was complete, the wound was irrigated and closed with 2-0 Vicryl subcutaneously and skin staples. Xeroform, ABDs, and tape applied, and the patient was sent to the recovery room in stable condition.   ESTIMATED BLOOD LOSS: 50 mL.   COMPLICATIONS: None.   SPECIMEN: None.   IMPLANT: A Synthes 7.3 mm cannulated screw x 4.    ____________________________ Leitha SchullerMichael J. Henry Utsey, MD mjm:TT D: 05/19/2014 15:28:48 ET T: 05/19/2014 19:41:24 ET JOB#: [id number]  cc: Leitha SchullerMichael J. Eriyah Fernando, MD, <Dictator> Leitha SchullerMICHAEL J Sheritha Louis MD ELECTRONICALLY SIGNED 05/19/2014 20:11

## 2014-09-17 NOTE — Discharge Summary (Signed)
PATIENT NAME:  Tracy Sawyer, Tracy Sawyer MR#:  [id number] DATE OF BIRTH:  [birth year]  DATE OF ADMISSION:  05/19/2014 DATE OF DISCHARGE:  05/22/2014  ADMITTING PHYSICIAN: Crissie Figures, MD .  DISCHARGING PHYSICIAN: Enid Baas, MD  PRIMARY CARE PHYSICIAN: Dennison Mascot, MD  CONSULTATIONS IN THE HOSPITAL:  1. Orthopedic consultation by Dr. Rosita Kea.  2. Neurology consultation by Dr. Loretha Brasil.  3. Pulmonary critical care consultation by Dr. Belia Heman   DISCHARGE DIAGNOSES: 1. Fall and left hip fracture, status post operative reduction and internal fixation.  2. Right pubic ramus fracture.  3. Two days living postoperative.  4. Acute delirium, postoperative. 5. Unresponsive episode secondary to MORPHINE and NORCO.  6. Hypertension.  7. Dementia.  8. Chronic kidney disease, stage III.  9. Vitamin D deficiency.   DISCHARGE MEDICATIONS: 1. Vitamin D3 2000 international units p.o. daily.  2. Hydrochlorothiazide 12.5 mg p.o. daily.  3. Namenda XR 28 mg p.o. daily.  4. Multivitamin 1 tablet p.o. daily.  5. Donepezil 10 mg p.o. daily.  6. Amlodipine/benazepril 10/40 mg 1 tablet p.o. daily.  7. Lovenox 40 mg injectable daily for 3 weeks.  8. Tramadol 50 mg q. 6 hours p.r.n. for moderate pain.  9. Tylenol 650 mg p.o. q. 4 hours p.r.n. for mild pain, maximum of 4 grams per day only.  10. Docusate calcium 240 mg p.o. daily.  11. Ferrous sulfate 325 milligrams 1 tablet p.o. b.i.d.   DISCHARGE DIET: Low-sodium diet.   DISCHARGE ACTIVITY: As tolerated.   FOLLOWUP INSTRUCTIONS:  1. Ortho follow-up as per recommended. 2. PCP follow-up in 1-2 weeks.  3.  Physical therapy.   LABORATORIES AND IMAGING STUDIES PRIOR TO DISCHARGE:  1. WBC 10.0, hemoglobin 9.6, hematocrit 29.3, platelet count 223. Sodium 138, potassium 3.8, chloride 104, bicarbonate 27, BUN 15, creatinine 1.6, glucose 80, and calcium of 8.4.  2. CT head without contrast showing no acute intracranial abnormality, mild cerebellar  atrophy with chronic microvascular ischemic changes. No changes otherwise.  3. ALT 25, AST 19, alkaline phosphatase 62, total bilirubin 0.4, albumin of 3.0.  4. ABG showing pH of 7.49, pCO2 of 32, pO2 of 66, bicarbonate 24, sats of 97% on room air.  5. Urinalysis negative for any infection.  6. CT of the pelvis without contrast on admission showing nondisplaced subcapital left femoral neck fracture and right obturator ring fractures with extraperitoneal pelvic hematoma.   BRIEF HOSPITAL COURSE: Ms. Kolker is a 80 year old elderly African American female with past medical history significant for dementia, hypertension, CKD stage III, lives at home by herself, presents to the hospital after she had an accidental fall and left hip fracture.  1. Fall, left hip fracture, right pubic ramus fracture, status post operative reduction internal fixation. Surgery done by Dr. Rosita Kea on 05/19/2014. Postoperatively, she had complications with unresponsive episode after narcotics with oral NORCO, and also IV MORPHINE, working with physical therapy work-up came back negative. She did become delirious, was admitted, monitored closely in the ICU, never lost a pulse and never got intubated. She was started on risperidone, seen by neurologist. She is back to her baseline at this time. The patient did work with physical therapy. They have recommended rehab at this time. She was advised only to take Tylenol for her pain. If there is severe pain, then maybe tramadol as needed. She is also being discharged on Lovenox for DVT prophylaxis post surgery for 2 weeks. She will follow up with ortho as recommended. 2. Postoperative anemia with hemoglobin stable around 9-10  range. No transfusion required. [facility], and is being discharged today.  3. Dementia. She is on Namenda, Aricept at home, which are being continued. At baseline, she is oriented x 3 with intermittent short memory loss. She is at her baseline  now.  4. Hypertension. Her home medications are being continued.   5. Her course has been otherwise uneventful.   DISCHARGE CONDITION: Stable.   DISCHARGE DISPOSITION: To [facility] short-term rehab.   TIME SPENT ON DISCHARGE: 40 minutes.    ____________________________ Enid Baasadhika Rual Vermeer, MD rk:mw D: 05/22/2014 13:18:30 ET T: 05/22/2014 13:50:20 ET JOB#: [id number]  cc: Enid Baasadhika Yoona Ishii, MD, <Dictator> Leitha SchullerMichael J. Menz, MD Dennison MascotLemont Morrisey, MD Enid BaasADHIKA Regla Fitzgibbon MD ELECTRONICALLY SIGNED 05/27/2014 16:46

## 2014-10-27 ENCOUNTER — Telehealth: Payer: Self-pay | Admitting: Family Medicine

## 2014-10-27 ENCOUNTER — Other Ambulatory Visit: Payer: Self-pay | Admitting: Family Medicine

## 2014-10-27 MED ORDER — AMLODIPINE BESYLATE-VALSARTAN 5-320 MG PO TABS: 1.0000 | ORAL_TABLET | Freq: Every day | ORAL | Status: DC

## 2014-10-27 NOTE — Telephone Encounter (Signed)
Is Patient suppost to still be on Exforge and Amlodopine?

## 2014-11-13 ENCOUNTER — Telehealth: Payer: Self-pay

## 2014-11-13 NOTE — Telephone Encounter (Signed)
Pt needs appointment per dr. Carlynn Purl about medication

## 2014-12-20 ENCOUNTER — Other Ambulatory Visit: Payer: Self-pay | Admitting: Family Medicine

## 2014-12-27 ENCOUNTER — Encounter: Payer: Self-pay | Admitting: Family Medicine

## 2014-12-27 DIAGNOSIS — F0390 Unspecified dementia without behavioral disturbance: Secondary | ICD-10-CM | POA: Insufficient documentation

## 2014-12-27 DIAGNOSIS — N183 Chronic kidney disease, stage 3 unspecified: Secondary | ICD-10-CM | POA: Insufficient documentation

## 2014-12-27 DIAGNOSIS — L309 Dermatitis, unspecified: Secondary | ICD-10-CM | POA: Insufficient documentation

## 2014-12-27 DIAGNOSIS — I10 Essential (primary) hypertension: Secondary | ICD-10-CM | POA: Insufficient documentation

## 2014-12-27 DIAGNOSIS — I8393 Asymptomatic varicose veins of bilateral lower extremities: Secondary | ICD-10-CM | POA: Insufficient documentation

## 2014-12-27 DIAGNOSIS — D638 Anemia in other chronic diseases classified elsewhere: Secondary | ICD-10-CM | POA: Insufficient documentation

## 2014-12-27 DIAGNOSIS — E46 Unspecified protein-calorie malnutrition: Secondary | ICD-10-CM | POA: Insufficient documentation

## 2014-12-27 DIAGNOSIS — E559 Vitamin D deficiency, unspecified: Secondary | ICD-10-CM | POA: Insufficient documentation

## 2014-12-27 DIAGNOSIS — M47817 Spondylosis without myelopathy or radiculopathy, lumbosacral region: Secondary | ICD-10-CM | POA: Insufficient documentation

## 2014-12-27 DIAGNOSIS — R630 Anorexia: Secondary | ICD-10-CM | POA: Insufficient documentation

## 2014-12-27 DIAGNOSIS — E785 Hyperlipidemia, unspecified: Secondary | ICD-10-CM | POA: Insufficient documentation

## 2014-12-27 DIAGNOSIS — N2889 Other specified disorders of kidney and ureter: Secondary | ICD-10-CM | POA: Insufficient documentation

## 2014-12-27 DIAGNOSIS — J309 Allergic rhinitis, unspecified: Secondary | ICD-10-CM | POA: Insufficient documentation

## 2014-12-27 DIAGNOSIS — R7989 Other specified abnormal findings of blood chemistry: Secondary | ICD-10-CM | POA: Insufficient documentation

## 2014-12-29 ENCOUNTER — Ambulatory Visit: Payer: Self-pay | Admitting: Family Medicine

## 2015-01-27 ENCOUNTER — Ambulatory Visit: Payer: Medicare PPO | Admitting: Neurology

## 2015-01-27 ENCOUNTER — Telehealth: Payer: Self-pay

## 2015-01-27 NOTE — Telephone Encounter (Signed)
Patient did not come to a follow up appointment today.  

## 2015-02-03 ENCOUNTER — Encounter: Payer: Self-pay | Admitting: Neurology

## 2015-02-10 ENCOUNTER — Ambulatory Visit: Payer: Medicare PPO | Admitting: Podiatry

## 2015-02-24 ENCOUNTER — Ambulatory Visit: Payer: Medicare PPO | Admitting: Podiatry

## 2015-04-13 ENCOUNTER — Other Ambulatory Visit: Payer: Self-pay | Admitting: Family Medicine

## 2015-04-13 NOTE — Telephone Encounter (Signed)
No longer a patient at [facility]. She goes to [facility].

## 2015-04-13 NOTE — Telephone Encounter (Signed)
Patient requesting refill. 

## 2015-07-17 ENCOUNTER — Other Ambulatory Visit: Payer: Self-pay | Admitting: Nurse Practitioner

## 2015-07-17 IMAGING — CR DG HIP COMPLETE 2+V*L*
1 series · 3 of 3 positions shown · non-contrast
Comparison: None.

CLINICAL DATA: Left-sided hip pain.

EXAM:
LEFT HIP - COMPLETE 2+ VIEW

[Series 1: dxr hip left complete · 0.14mm/px · 3 of 3 slices shown]
[im 1/3]
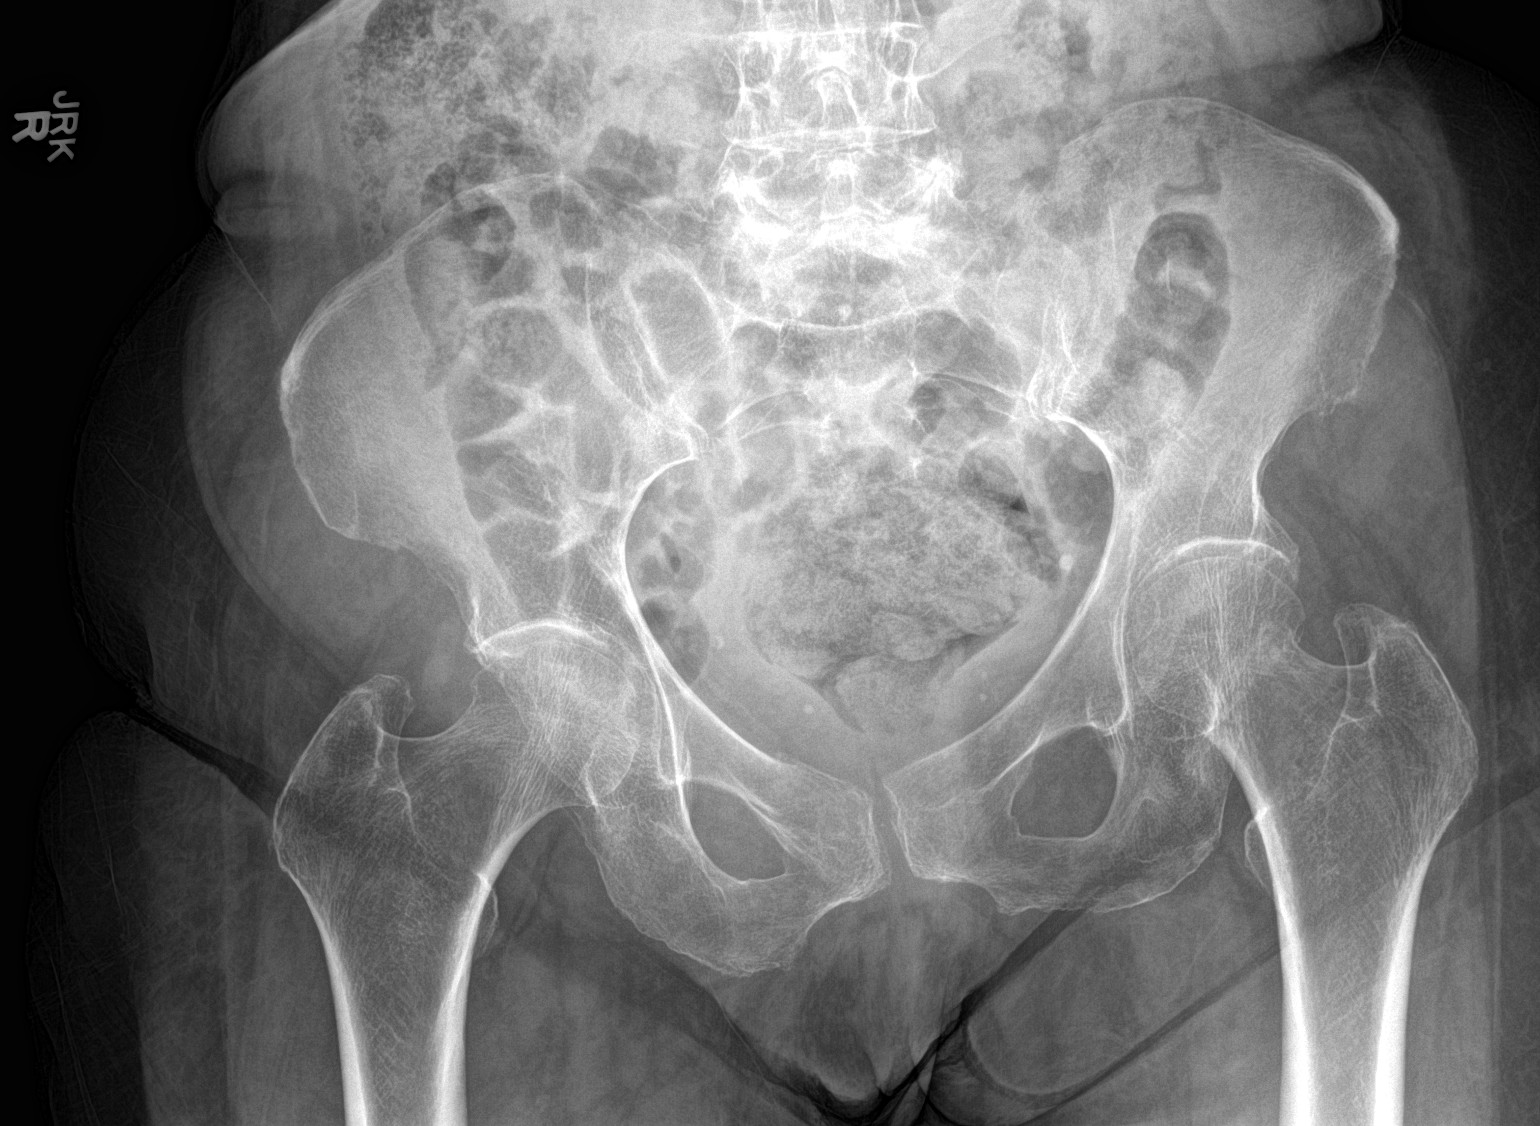
[im 2/3]
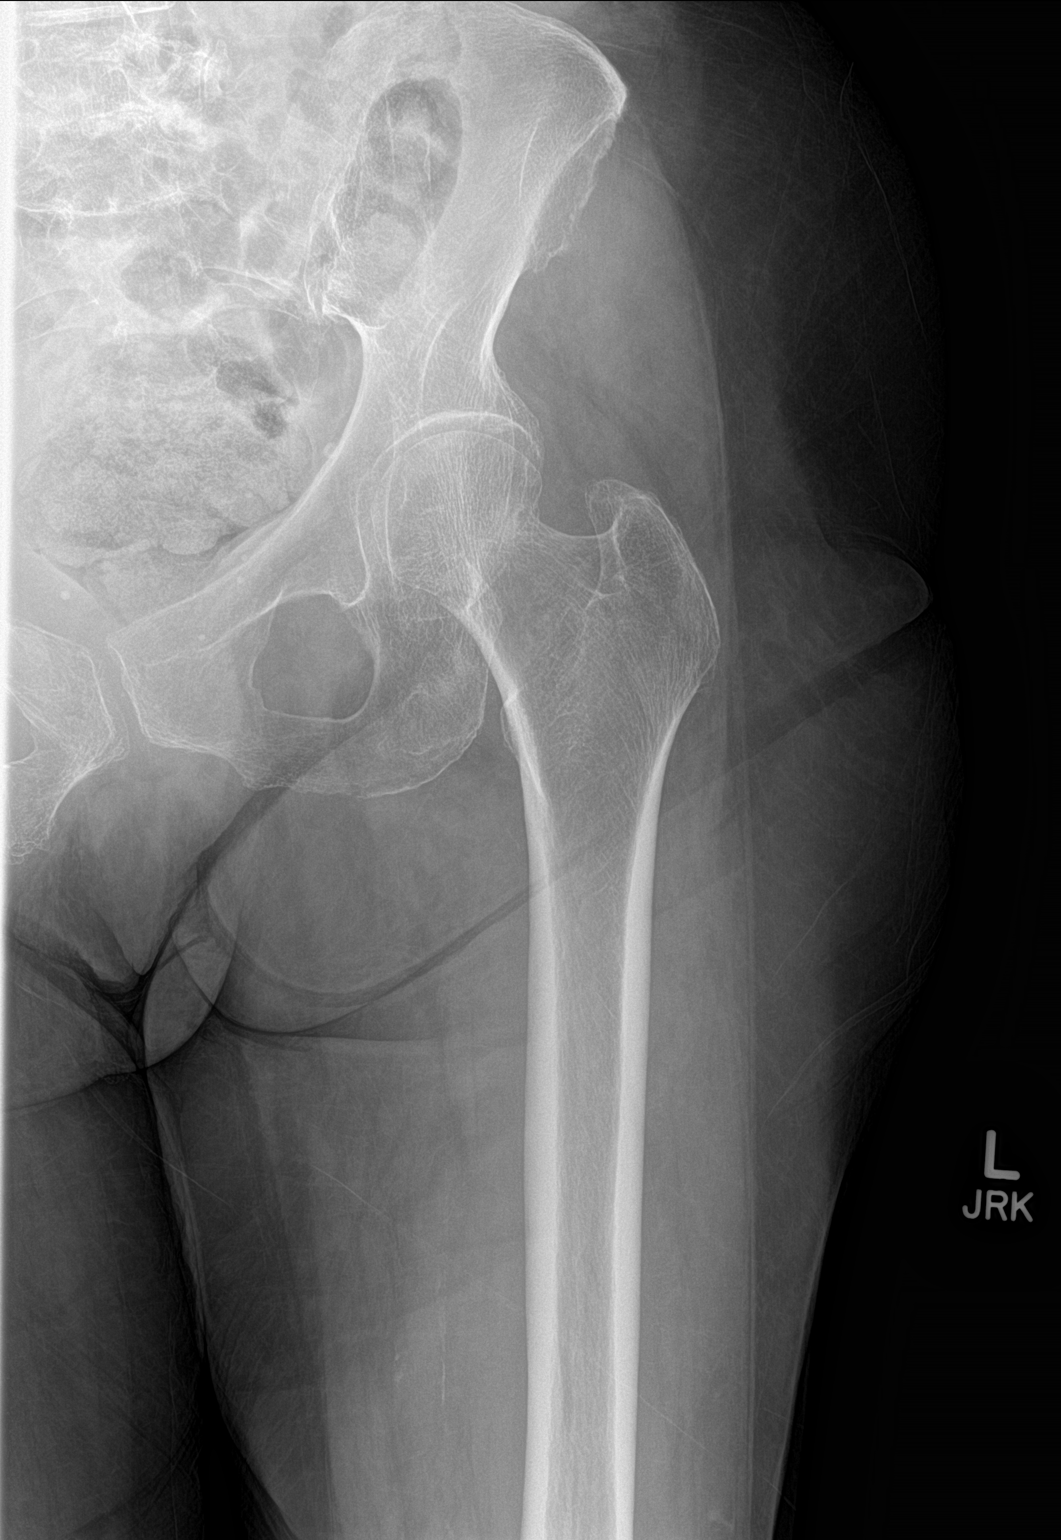
[im 3/3]
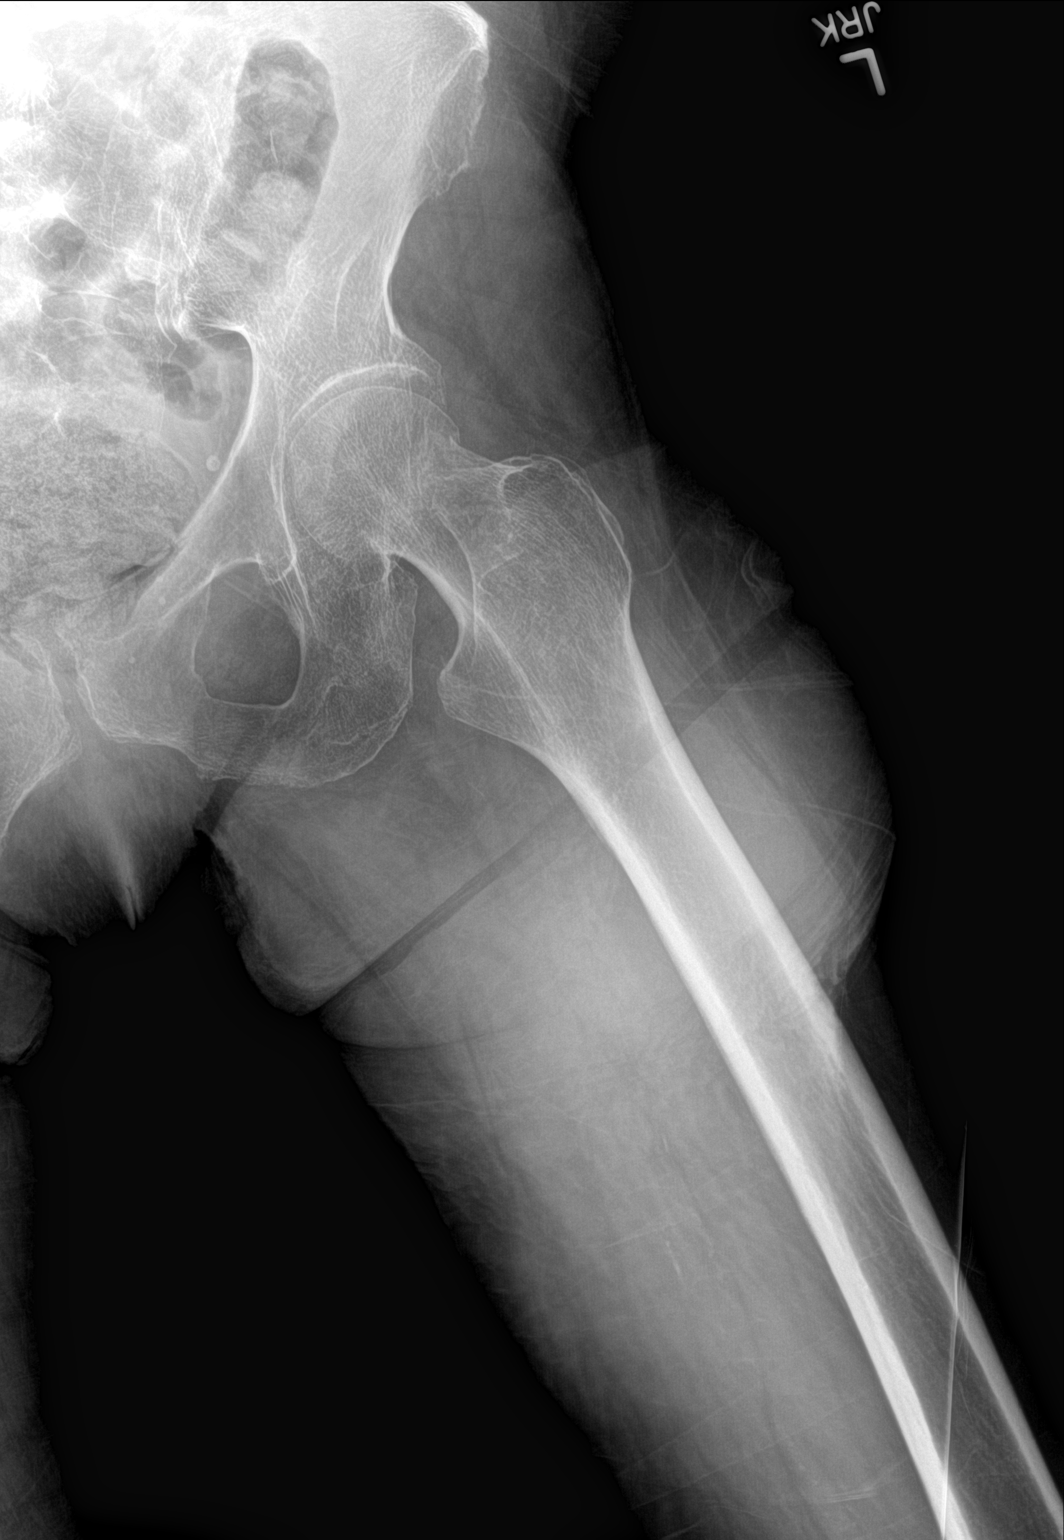

[3 of 3 positions shown; findings below may reference images not displayed]

FINDINGS: There is subtle cortical irregularity at the lateral femoral head
neck junction on the left. Subtle smudging of trabecular markings
across the left femoral neck as noted. No dislocation.

There is evidence of a fracture through the superior pubic ramus on
the right, nondisplaced.

Subjective osteopenia.
IMPRESSION: 1. Cannot exclude a nondisplaced left femoral neck fracture.
Cross-sectional imaging followup recommended, preferably MRI.
2. Nondisplaced right superior pubic ramus fracture.

## 2015-07-18 IMAGING — CR DG HIP 1V*L*
2 series · 2 of 2 positions shown · non-contrast
Comparison: Left hip radiographs and CT pelvis 05/18/2014.

CLINICAL DATA: Fracture reduction/ fixation. Left femoral neck
fracture. Initial encounter.

EXAM:
LEFT HIP - 1 VIEW:
FLUOROSCOPY TIME:  0 min 32 seconds

[cont. (1 of 2)]
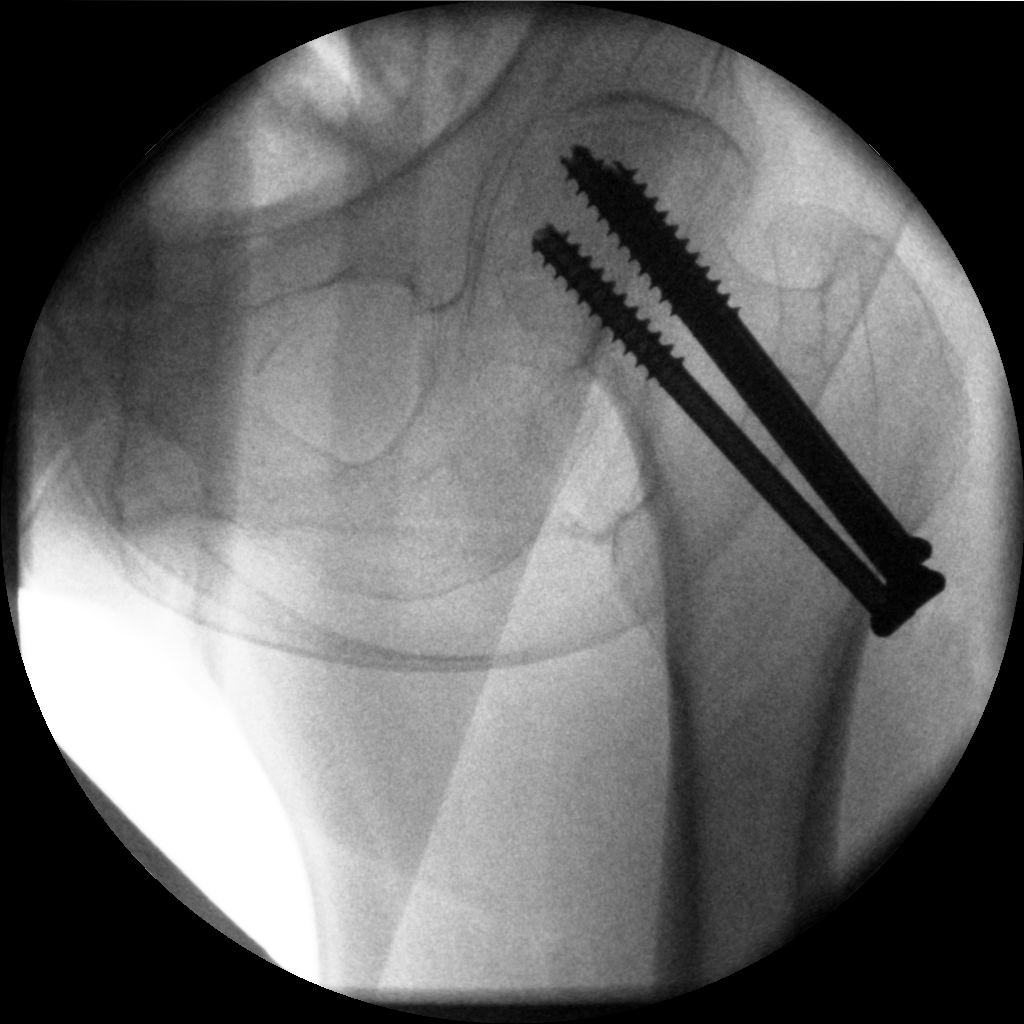

[cont. (2 of 2)]
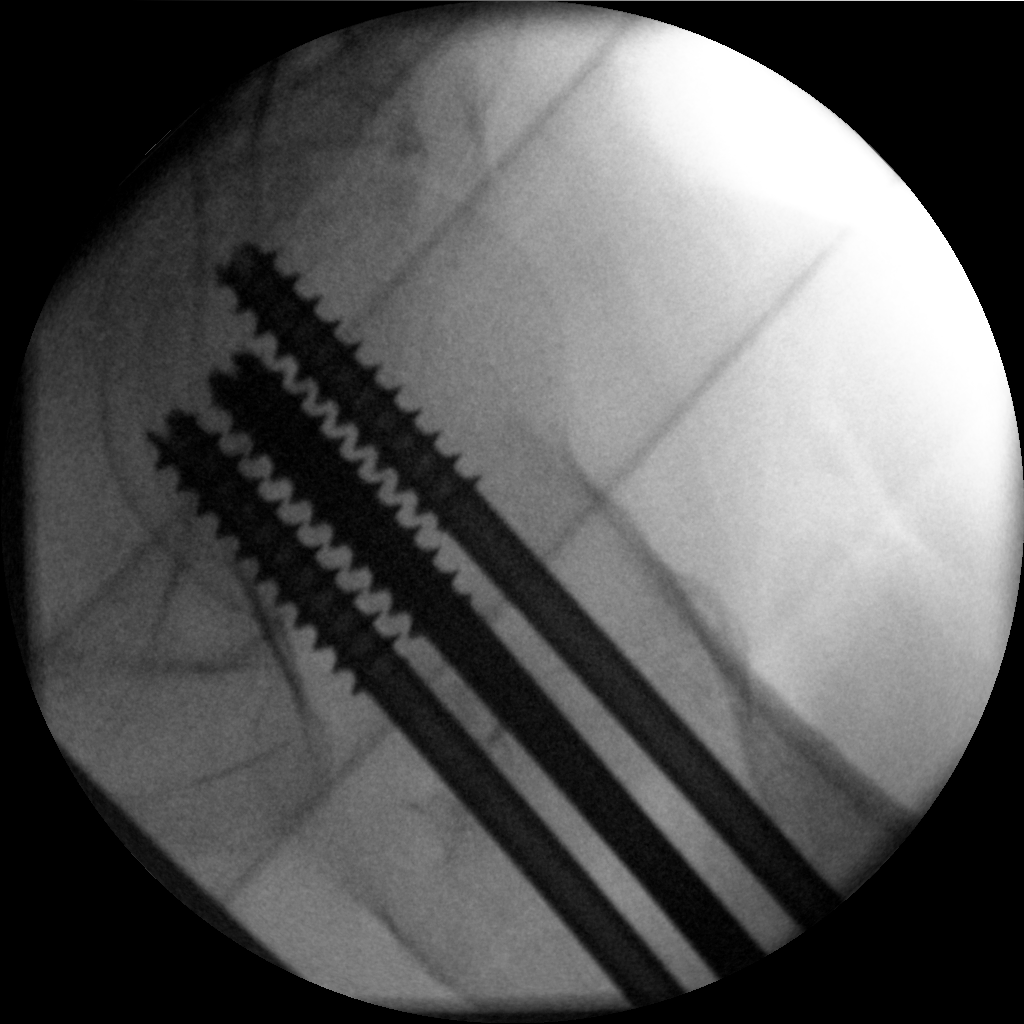

[2 of 2 positions shown; findings below may reference images not displayed]

FINDINGS: Two intraoperative spot fluoroscopic images of the left hip are
provided. These demonstrate interval fixation of the previously
described subcapital femur fracture by 3 lag screws which terminate
in the femoral head.
IMPRESSION: Intraoperative images during left femoral neck fracture fixation.

## 2017-05-25 ENCOUNTER — Inpatient Hospital Stay
Admission: EM | Admit: 2017-05-25 | Discharge: 2017-05-27 | DRG: 282 | Disposition: A | Discharge status: Hospice | Payer: Medicare Other | Attending: Internal Medicine | Admitting: Internal Medicine

## 2017-05-25 ENCOUNTER — Emergency Department: Payer: Medicare Other

## 2017-05-25 DIAGNOSIS — E1122 Type 2 diabetes mellitus with diabetic chronic kidney disease: Secondary | ICD-10-CM | POA: Diagnosis present

## 2017-05-25 DIAGNOSIS — N183 Chronic kidney disease, stage 3 (moderate): Secondary | ICD-10-CM | POA: Diagnosis present

## 2017-05-25 DIAGNOSIS — I129 Hypertensive chronic kidney disease with stage 1 through stage 4 chronic kidney disease, or unspecified chronic kidney disease: Secondary | ICD-10-CM | POA: Diagnosis present

## 2017-05-25 DIAGNOSIS — Z66 Do not resuscitate: Secondary | ICD-10-CM | POA: Diagnosis present

## 2017-05-25 DIAGNOSIS — Z886 Allergy status to analgesic agent status: Secondary | ICD-10-CM

## 2017-05-25 DIAGNOSIS — E785 Hyperlipidemia, unspecified: Secondary | ICD-10-CM | POA: Diagnosis present

## 2017-05-25 DIAGNOSIS — M810 Age-related osteoporosis without current pathological fracture: Secondary | ICD-10-CM | POA: Diagnosis present

## 2017-05-25 DIAGNOSIS — Z7401 Bed confinement status: Secondary | ICD-10-CM | POA: Diagnosis not present

## 2017-05-25 DIAGNOSIS — I214 Non-ST elevation (NSTEMI) myocardial infarction: Principal | ICD-10-CM | POA: Diagnosis present

## 2017-05-25 DIAGNOSIS — Z515 Encounter for palliative care: Secondary | ICD-10-CM | POA: Diagnosis not present

## 2017-05-25 DIAGNOSIS — R0602 Shortness of breath: Secondary | ICD-10-CM | POA: Diagnosis not present

## 2017-05-25 DIAGNOSIS — F0390 Unspecified dementia without behavioral disturbance: Secondary | ICD-10-CM | POA: Diagnosis present

## 2017-05-25 DIAGNOSIS — D72829 Elevated white blood cell count, unspecified: Secondary | ICD-10-CM | POA: Diagnosis present

## 2017-05-25 DIAGNOSIS — Z888 Allergy status to other drugs, medicaments and biological substances status: Secondary | ICD-10-CM | POA: Diagnosis not present

## 2017-05-25 DIAGNOSIS — Z880 Allergy status to penicillin: Secondary | ICD-10-CM

## 2017-05-25 LAB — URINALYSIS, COMPLETE (UACMP) WITH MICROSCOPIC
BACTERIA UA: NONE SEEN
BILIRUBIN URINE: NEGATIVE
Glucose, UA: NEGATIVE mg/dL
Hgb urine dipstick: NEGATIVE
Ketones, ur: NEGATIVE mg/dL
Leukocytes, UA: NEGATIVE
NITRITE: NEGATIVE
PROTEIN: 30 mg/dL — AB
Specific Gravity, Urine: 1.025 (ref 1.005–1.030)
pH: 6 (ref 5.0–8.0)

## 2017-05-25 LAB — BASIC METABOLIC PANEL
ANION GAP: 10 (ref 5–15)
BUN: 18 mg/dL (ref 6–20)
CHLORIDE: 102 mmol/L (ref 101–111)
CO2: 28 mmol/L (ref 22–32)
Calcium: 9.5 mg/dL (ref 8.9–10.3)
Creatinine, Ser: 1.26 mg/dL — ABNORMAL HIGH (ref 0.44–1.00)
GFR calc Af Amer: 45 mL/min — ABNORMAL LOW (ref >60)
GFR, EST NON AFRICAN AMERICAN: 39 mL/min — AB (ref >60)
Glucose, Bld: 142 mg/dL — ABNORMAL HIGH (ref 65–99)
POTASSIUM: 4.4 mmol/L (ref 3.5–5.1)
Sodium: 140 mmol/L (ref 135–145)

## 2017-05-25 LAB — CBC
HEMATOCRIT: 45.6 % (ref 35.0–47.0)
HEMOGLOBIN: 15.1 g/dL (ref 12.0–16.0)
MCH: 30.1 pg (ref 26.0–34.0)
MCHC: 33.1 g/dL (ref 32.0–36.0)
MCV: 91.2 fL (ref 80.0–100.0)
Platelets: 326 10*3/uL (ref 150–440)
RBC: 5 MIL/uL (ref 3.80–5.20)
RDW: 13.2 % (ref 11.5–14.5)
WBC: 14.3 10*3/uL — ABNORMAL HIGH (ref 3.6–11.0)

## 2017-05-25 LAB — TROPONIN I: TROPONIN I: 2.27 ng/mL — AB (ref <0.03)

## 2017-05-25 MED ORDER — ALBUTEROL SULFATE (2.5 MG/3ML) 0.083% IN NEBU: 5.0000 mg | INHALATION_SOLUTION | Freq: Once | RESPIRATORY_TRACT | Status: DC

## 2017-05-25 NOTE — ED Notes (Addendum)
PT continues to sound as though she is not clearing her airway of sputum. RN attempted to suction mouth when metal was noted in the back of pts throat. Metal appeared to be part of dentures. RN and Xray tech were able to pull piece out of the back of pts throat and it was a denture like mouth piece. Pt immediately able to breath better and respirations sound less labored.

## 2017-05-25 NOTE — ED Triage Notes (Signed)
PT to ED from [facility] after family requested ED evaluation. PT has been drowling more recently and over the last three days EMS reports pt has had a congested cough. PT sounds congested at this time. Per EMS [facility] reports pt is at baseline and is nonverbal.  No fevers reported. Hypotension reported to EMS and Chest Xray performed at [facility], unsure of results.

## 2017-05-25 NOTE — ED Notes (Signed)
PT remains resting comfortably. Pt in NAD and remains on 2L while sleeping. Family remains at bedside and has been updated as best as possible.

## 2017-05-25 NOTE — ED Provider Notes (Addendum)
[facility] Emergency Department Provider Note  ____________________________________________  Time seen: Approximately 9:23 PM  I have reviewed the triage vital signs and the nursing notes.   HISTORY  Chief Complaint Shortness of Breath  Level 5 caveat:  Portions of the history and physical were unable to be obtained due to AMS and advanced dementia   HPI Tracy Sawyer is a 83 y.o. female with a history of advanced dementia, chronic kidney disease, anemia, diabetes who presents for shortness of breath. History is gathered from patient's granddaughter and from EMS. Since yesterday patient has become progressively more somnolent. She is nonverbal and bedbound at baseline however she is able to be awakened easily and since yesterday this has not been possible. She has had some rattling with her breathing and increased work of breathing. No vomiting, no diarrhea. She has had a low-grade temp of 47F since yesterday. Patient has no history of COPD or asthma. CODE STATUS confirmed with daughter and granddaughter to be DNR/DNI  Past Medical History:  Diagnosis Date  . Allergy   . Anemia   . Anorexia   . Chronic kidney disease   . Dementia   . Hyperlipidemia   . Hypertension   . Kidney mass   . Malnutrition related diabetes mellitus   . Memory loss   . Osteoporosis     Patient Active Problem List   Diagnosis Date Noted  . NSTEMI (non-ST elevated myocardial infarction) (HCC) 05/25/2017  . Allergic rhinitis 12/27/2014  . Anemia in chronic illness 12/27/2014  . Benign essential HTN 12/27/2014  . Chronic kidney disease (CKD), stage III (moderate) (HCC) 12/27/2014  . Dementia, in, senility 12/27/2014  . Dermatitis, eczematoid 12/27/2014  . Lumbosacral spondylosis without myelopathy 12/27/2014  . Dyslipidemia 12/27/2014  . Anorexia 12/27/2014  . Protein calorie malnutrition (HCC) 12/27/2014  . Left kidney mass 12/27/2014  . Elevated platelet count  12/27/2014  . Varicose veins of lower extremities 12/27/2014  . Vitamin D deficiency 12/27/2014  . Memory loss 07/23/2014  . Closed fracture of femur, neck (HCC) 05/19/2014  . Dementia due to medical condition with behavioral disturbance 11/22/2012  . DEGENERATIVE JOINT DISEASE 06/30/2009  . LEG PAIN, CHRONIC 06/30/2009  . OSTEOPOROSIS 06/30/2009    Past Surgical History:  Procedure Laterality Date  . ABDOMINAL HYSTERECTOMY    . cataract surgery Right   . ganglion cyst resection Left   . HIP FRACTURE SURGERY    . vocal cord nodule resection    . WRIST SURGERY      Prior to Admission medications   Medication Sig Start Date End Date Taking? Authorizing Provider  acetaminophen (TYLENOL) 650 MG suppository Place 1 suppository (650 mg total) rectally every 6 (six) hours as needed for mild pain (or Fever >/= 101). 05/27/17   Alford Highland, MD  glycopyrrolate (ROBINUL) 0.2 MG/ML injection Inject 1 mL (0.2 mg total) into the vein every 4 (four) hours. 05/27/17   Alford Highland, MD  LORazepam (ATIVAN) 2 MG/ML injection Inject 0.5 mLs (1 mg total) into the vein every 4 (four) hours as needed for anxiety (shortness of breath). 05/27/17   Alford Highland, MD  morphine 2 MG/ML injection Inject 0.5-1 mLs (1-2 mg total) into the vein every hour as needed. 05/27/17   Alford Highland, MD  Morphine Sulfate (MORPHINE CONCENTRATE) 10 MG/0.5ML SOLN concentrated solution Take 0.5 mLs (10 mg total) by mouth every hour as needed for severe pain or shortness of breath. 05/27/17   Alford Highland, MD  scopolamine (  TRANSDERM-SCOP) 1 MG/3DAYS Place 1 patch (1.5 mg total) onto the skin every 3 (three) days. 05/27/17   Alford HighlandWieting, Richard, MD    Allergies Aspirin adult low [aspirin]; Fosamax [alendronate sodium]; and Penicillins  Family History  Problem Relation Age of Onset  . Dementia Mother   . Stroke Mother   . Diabetes Father   . Peripheral vascular disease Father   . Cancer Unknown        1 sibling  .  Cancer Unknown        1 sibling    Social History Social History   Tobacco Use  . Smoking status: Never Smoker  . Smokeless tobacco: Never Used  Substance Use Topics  . Alcohol use: No  . Drug use: No    Review of Systems  Constitutional: Negative for fever. + worsening AMS Respiratory: + shortness of breath and cough Gastrointestinal: Negative for vomiting or diarrhea.  Level 5 caveat:  Portions of the history and physical were unable to be obtained due to AMS and dementia  ____________________________________________   PHYSICAL EXAM:  VITAL SIGNS: ED Triage Vitals [05/25/17 1907]  Enc Vitals Group     BP (!) 131/92     Pulse Rate 74     Resp 14     Temp 98.1 F (36.7 C)     Temp Source Oral     SpO2 (S) (!) 84 %     Weight      Height      Head Circumference      Peak Flow      Pain Score      Pain Loc      Pain Edu?      Excl. in GC?     Constitutional: Patient briefly arouses to sternal rub but falls back asleep. HEENT:      Head: Normocephalic and atraumatic.         Eyes: Conjunctivae are normal. Sclera is non-icteric.       Mouth/Throat: Mucous membranes are moist.       Neck: Supple with no signs of meningismus. Cardiovascular: Regular rate and rhythm. No murmurs, gallops, or rubs. 2+ symmetrical distal pulses are present in all extremities. No JVD. Respiratory: Normal respiratory effort. Shallow breathing, normal sats after dentures were removed from oropharynxLungs are clear to auscultation bilaterally. No wheezes, crackles, or rhonchi.  Gastrointestinal: Soft, non tender, and non distended with positive bowel sounds. No rebound or guarding. Musculoskeletal: Nontender with normal range of motion in all extremities. No edema, cyanosis, or erythema of extremities. Neurologic: Withdrawal from pain, opens eyes to painful stimuli, does not follow commands. Skin: Skin is warm, dry and intact. No rash  noted.   ____________________________________________   LABS (all labs ordered are listed, but only abnormal results are displayed)  Labs Reviewed  BASIC METABOLIC PANEL - Abnormal; Notable for the following components:      Result Value   Glucose, Bld 142 (*)    Creatinine, Ser 1.26 (*)    GFR calc non Af Amer 39 (*)    GFR calc Af Amer 45 (*)    All other components within normal limits  CBC - Abnormal; Notable for the following components:   WBC 14.3 (*)    All other components within normal limits  URINALYSIS, COMPLETE (UACMP) WITH MICROSCOPIC - Abnormal; Notable for the following components:   Color, Urine AMBER (*)    APPearance CLEAR (*)    Protein, ur 30 (*)    Squamous Epithelial /  LPF 0-5 (*)    All other components within normal limits  TROPONIN I - Abnormal; Notable for the following components:   Troponin I 2.27 (*)    All other components within normal limits  BASIC METABOLIC PANEL - Abnormal; Notable for the following components:   Glucose, Bld 143 (*)    BUN 21 (*)    Creatinine, Ser 1.11 (*)    GFR calc non Af Amer 46 (*)    GFR calc Af Amer 53 (*)    All other components within normal limits  CBC - Abnormal; Notable for the following components:   WBC 15.2 (*)    All other components within normal limits  GLUCOSE, CAPILLARY - Abnormal; Notable for the following components:   Glucose-Capillary 119 (*)    All other components within normal limits  MRSA PCR SCREENING   ____________________________________________  EKG  ED ECG REPORT I, Nita Sickle, the attending physician, personally viewed and interpreted this ECG.  Normal sinus rhythm, rate of 87, normal intervals, normal axis, T-wave inversions in anterior, inferior, and lateral leads, no ST elevations or depressions. New when compared to prior from 2015  21:46 - normal sinus rhythm, rate of 82, normal intervals, normal axis, persistent T-wave inversions diffusely with no ST elevation.  Unchanged from initial. ____________________________________________  RADIOLOGY  CXR: Low volume film with right basilar atelectasis. ____________________________________________   PROCEDURES  Procedure(s) performed: None Procedures Critical Care performed: yes  CRITICAL CARE Performed by: Nita Sickle  ?  Total critical care time: 40 min  Critical care time was exclusive of separately billable procedures and treating other patients.  Critical care was necessary to treat or prevent imminent or life-threatening deterioration.  Critical care was time spent personally by me on the following activities: development of treatment plan with patient and/or surrogate as well as nursing, discussions with consultants, evaluation of patient's response to treatment, examination of patient, obtaining history from patient or surrogate, ordering and performing treatments and interventions, ordering and review of laboratory studies, ordering and review of radiographic studies, pulse oximetry and re-evaluation of patient's condition.  ____________________________________________   INITIAL IMPRESSION / ASSESSMENT AND PLAN / ED COURSE  83 y.o. female with a history of advanced dementia, chronic kidney disease, anemia, diabetes who presents for shortness of breath, noisy breathing, and worsening somnolence. Patient is arousable to sternal rub but falls back to sleep, withdraws to painful stimuli, nonverbal which is her baseline. Initially patient was hypoxic with increased secretions and work of breathing. She was found to have her dentures in her oropharynx. Those were removed with resolution of her hypoxia and increased work of breathing. Patient does seem to have worsening encephalopathy since yesterday and does have a white count of 14. Remaining of her labs are within her baseline. Her abdomen is soft and she does not seem to have any tenderness. UA is pending to rule out UTI.  Clinical  Course as of May 28 1854  Morey Hummingbird May 25, 2017  2158 UA negative for UTI. Troponin elevated at 2.27 with new EKG changes. Repeat EKG showing no evidence of STEMI. Will give ASA and admit to Hospitalist.  [CV]  2209 Family would like for patient to be admitted for IVF and monitoring but do not wish any invasive procedures such as catheterization or heparin.  [CV]    Clinical Course User Index [CV] Don Perking [place], MD     As part of my medical decision making, I reviewed the following data within the electronic medical  record:  Nursing notes reviewed and incorporated, Labs reviewed , EKG interpreted , Old EKG reviewed, Old chart reviewed, Radiograph reviewed , Discussed with admitting physician , Notes from prior ED visits and [place] Controlled Substance Database    Pertinent labs & imaging results that were available during my care of the patient were reviewed by me and considered in my medical decision making (see chart for details).    ____________________________________________   FINAL CLINICAL IMPRESSION(S) / ED DIAGNOSES  Final diagnoses:  NSTEMI (non-ST elevated myocardial infarction) (HCC)      NEW MEDICATIONS STARTED DURING THIS VISIT:  ED Discharge Orders        Ordered    acetaminophen (TYLENOL) 650 MG suppository  Every 6 hours PRN     05/27/17 0802    glycopyrrolate (ROBINUL) 0.2 MG/ML injection  Every 4 hours     05/27/17 0802    LORazepam (ATIVAN) 2 MG/ML injection  Every 4 hours PRN     05/27/17 0802    morphine 2 MG/ML injection  Every 1 hour PRN     05/27/17 0802    Morphine Sulfate (MORPHINE CONCENTRATE) 10 MG/0.5ML SOLN concentrated solution  Every 1 hour PRN     05/27/17 0802    scopolamine (TRANSDERM-SCOP) 1 MG/3DAYS  every 72 hours     05/27/17 0802       Note:  This document was prepared using Dragon voice recognition software and may include unintentional dictation errors.    Don Perking, [place], MD 05/25/17 2200    Nita Sickle,  MD 05/28/17 [phone]

## 2017-05-26 ENCOUNTER — Other Ambulatory Visit: Payer: Self-pay

## 2017-05-26 LAB — BASIC METABOLIC PANEL
Anion gap: 11 (ref 5–15)
BUN: 21 mg/dL — ABNORMAL HIGH (ref 6–20)
CO2: 24 mmol/L (ref 22–32)
CREATININE: 1.11 mg/dL — AB (ref 0.44–1.00)
Calcium: 8.9 mg/dL (ref 8.9–10.3)
Chloride: 105 mmol/L (ref 101–111)
GFR calc non Af Amer: 46 mL/min — ABNORMAL LOW (ref >60)
GFR, EST AFRICAN AMERICAN: 53 mL/min — AB (ref >60)
Glucose, Bld: 143 mg/dL — ABNORMAL HIGH (ref 65–99)
Potassium: 3.9 mmol/L (ref 3.5–5.1)
SODIUM: 140 mmol/L (ref 135–145)

## 2017-05-26 LAB — CBC
HCT: 39.8 % (ref 35.0–47.0)
HEMOGLOBIN: 13.4 g/dL (ref 12.0–16.0)
MCH: 30.8 pg (ref 26.0–34.0)
MCHC: 33.7 g/dL (ref 32.0–36.0)
MCV: 91.6 fL (ref 80.0–100.0)
PLATELETS: 304 10*3/uL (ref 150–440)
RBC: 4.34 MIL/uL (ref 3.80–5.20)
RDW: 13 % (ref 11.5–14.5)
WBC: 15.2 10*3/uL — AB (ref 3.6–11.0)

## 2017-05-26 LAB — GLUCOSE, CAPILLARY: Glucose-Capillary: 119 mg/dL — ABNORMAL HIGH (ref 65–99)

## 2017-05-26 LAB — MRSA PCR SCREENING: MRSA by PCR: NEGATIVE

## 2017-05-26 MED ORDER — CHLORHEXIDINE GLUCONATE 0.12 % MT SOLN
15.0000 mL | Freq: Two times a day (BID) | OROMUCOSAL | Status: DC
  Administered 2017-05-26: 15 mL via OROMUCOSAL
  Filled 2017-05-26: qty 15

## 2017-05-26 MED ORDER — MORPHINE SULFATE (PF) 2 MG/ML IV SOLN
0.5000 mg | INTRAVENOUS | Status: DC | PRN
Start: 2017-05-26 — End: 2017-05-26

## 2017-05-26 MED ORDER — BISACODYL 5 MG PO TBEC: 5.0000 mg | DELAYED_RELEASE_TABLET | Freq: Every day | ORAL | Status: DC | PRN

## 2017-05-26 MED ORDER — ONDANSETRON HCL 4 MG PO TABS: 4.0000 mg | ORAL_TABLET | Freq: Four times a day (QID) | ORAL | Status: DC | PRN

## 2017-05-26 MED ORDER — ACETAMINOPHEN 650 MG RE SUPP: 650.0000 mg | Freq: Four times a day (QID) | RECTAL | Status: DC | PRN

## 2017-05-26 MED ORDER — HYDROCODONE-ACETAMINOPHEN 5-325 MG PO TABS: 1.0000 | ORAL_TABLET | ORAL | Status: DC | PRN

## 2017-05-26 MED ORDER — POLYETHYLENE GLYCOL 3350 17 G PO PACK: 17.0000 g | PACK | Freq: Every day | ORAL | Status: DC | PRN

## 2017-05-26 MED ORDER — ORAL CARE MOUTH RINSE
15.0000 mL | Freq: Two times a day (BID) | OROMUCOSAL | Status: DC
  Administered 2017-05-26 – 2017-05-27 (×2): 15 mL via OROMUCOSAL

## 2017-05-26 MED ORDER — SODIUM CHLORIDE 0.9% FLUSH
3.0000 mL | Freq: Two times a day (BID) | INTRAVENOUS | Status: DC
  Administered 2017-05-26 – 2017-05-27 (×2): 3 mL via INTRAVENOUS

## 2017-05-26 MED ORDER — HEPARIN SODIUM (PORCINE) 5000 UNIT/ML IJ SOLN
5000.0000 [IU] | Freq: Three times a day (TID) | INTRAMUSCULAR | Status: DC
  Administered 2017-05-26 (×2): 5000 [IU] via SUBCUTANEOUS
  Filled 2017-05-26 (×2): qty 1

## 2017-05-26 MED ORDER — LORAZEPAM 2 MG/ML IJ SOLN: 1.0000 mg | INTRAMUSCULAR | Status: DC | PRN

## 2017-05-26 MED ORDER — ACETAMINOPHEN 325 MG PO TABS: 650.0000 mg | ORAL_TABLET | Freq: Four times a day (QID) | ORAL | Status: DC | PRN

## 2017-05-26 MED ORDER — ADULT MULTIVITAMIN W/MINERALS CH: 1.0000 | ORAL_TABLET | Freq: Every day | ORAL | Status: DC

## 2017-05-26 MED ORDER — ASPIRIN EC 81 MG PO TBEC: 81.0000 mg | DELAYED_RELEASE_TABLET | Freq: Every day | ORAL | Status: DC

## 2017-05-26 MED ORDER — TRAZODONE HCL 50 MG PO TABS: 25.0000 mg | ORAL_TABLET | Freq: Every evening | ORAL | Status: DC | PRN

## 2017-05-26 MED ORDER — SODIUM CHLORIDE 0.9 % IV SOLN
INTRAVENOUS | Status: DC
  Administered 2017-05-26: 02:00:00 via INTRAVENOUS

## 2017-05-26 MED ORDER — DOCUSATE SODIUM 100 MG PO CAPS: 100.0000 mg | ORAL_CAPSULE | Freq: Two times a day (BID) | ORAL | Status: DC

## 2017-05-26 MED ORDER — ONDANSETRON HCL 4 MG/2ML IJ SOLN: 4.0000 mg | Freq: Four times a day (QID) | INTRAMUSCULAR | Status: DC | PRN

## 2017-05-26 MED ORDER — GLYCOPYRROLATE 0.2 MG/ML IJ SOLN
0.2000 mg | INTRAMUSCULAR | Status: DC
  Administered 2017-05-26 – 2017-05-27 (×5): 0.2 mg via INTRAVENOUS
  Filled 2017-05-26 (×8): qty 1

## 2017-05-26 MED ORDER — MEMANTINE HCL ER 14 MG PO CP24
14.0000 mg | ORAL_CAPSULE | Freq: Every day | ORAL | Status: DC
  Filled 2017-05-26: qty 1

## 2017-05-26 MED ORDER — MORPHINE SULFATE (PF) 2 MG/ML IV SOLN: 1.0000 mg | INTRAVENOUS | Status: DC | PRN

## 2017-05-26 NOTE — Clinical Social Work Note (Signed)
Clinical Social Work Assessment  Patient Details  Name: Tracy Sawyer MRN: [id number] Date of Birth: 03/16/1935  Date of referral:  05/26/17               Reason for consult:  End of Life/Hospice                Permission sought to share information with:  Facility Contact Representative Permission granted to share information::  Yes, Verbal Permission Granted  Name::        Agency::     Relationship::     Contact Information:     Housing/Transportation Living arrangements for the past 2 months:  Skilled Nursing Facility Source of Information:  Power of Attorney, Adult Children Patient Interpreter Needed:  None Criminal Activity/Legal Involvement Pertinent to Current Situation/Hospitalization:  No - Comment as needed Significant Relationships:  Adult Children Lives with:  Facility Resident Do you feel safe going back to the place where you live?    Need for family participation in patient care:  Yes (Comment)  Care giving concerns:  Patient is a long term care resident at [facility] per patient's daughter Tracy Sawyer.    Social Worker assessment / plan:  Clinical Social Worker (CSW) received a residential hospice placement consult from MD. CSW met with patient's daughter Tracy Sawyer who stated that she is patient's HPOA and her phone # is [phone]. Patient's granddauhgter Tracy Sawyer was also at bedside. CSW introduced self and explained role of CSW department. Per daughter patient has been at [facility] for about 1.5 years. Per daughter patient has been made comfort care and they would like for patient to go to a hospice facility. CSW provided hospice facility choice. Tracy Sawyer chose [facility] in [place]. Per Karen Collins liaison they can accept patient tomorrow at the hospice home. MD aware of above. CSW will continue to follow and assist as needed.   Employment status:  Disabled (Comment on whether or not currently receiving Disability),  Retired Insurance information:  Managed Medicare PT Recommendations:  Not assessed at this time Information / Referral to community resources:  Other (Comment Required)(Residential hospice placement. )  Patient/Family's Response to care:  Patient's daughter wants patient to be transferred to the [facility].   Patient/Family's Understanding of and Emotional Response to Diagnosis, Current Treatment, and Prognosis:  Patient's daughter understands that patient has been made comfort care and has a poor prognosis.    Emotional Assessment Appearance:  Appears stated age Attitude/Demeanor/Rapport:  Unable to Assess Affect (typically observed):  Unable to Assess Orientation:  Fluctuating Orientation (Suspected and/or reported Sundowners), Oriented to Self Alcohol / Substance use:  Not Applicable Psych involvement (Current and /or in the community):  No (Comment)  Discharge Needs  Concerns to be addressed:  Discharge Planning Concerns Readmission within the last 30 days:  No Current discharge risk:  Dependent with Mobility, Cognitively Impaired, Chronically ill, Terminally ill Barriers to Discharge:  Continued Medical Work up   ,  M, LCSW 05/26/2017, 3:24 PM  

## 2017-05-26 NOTE — Progress Notes (Signed)
On admission patient was accompanied by daughter which was only able to provide limited admission information. Patient was non verbal,eyes remained closed and was only able to slightly respond to touch. Patient was on 2L of supplemental oxygen initiated in the ED. Cardiac monitor was started and prophylactic foam dressing was applied to the sacrum.

## 2017-05-26 NOTE — Progress Notes (Signed)
[place] [facility] [place] NAME: Tracy Sawyer    MR#:  [id number]  DATE OF BIRTH:  12-02-1934  SUBJECTIVE:   Patient is nonverbal and unresponsive.  With extended family including healthcare power of attorney patient's daughter [place]:   Review of Systems  Unable to perform ROS: Patient unresponsive    DRUG ALLERGIES:   Allergies  Allergen Reactions  . Aspirin Adult Low [Aspirin]   . Fosamax [Alendronate Sodium]   . Penicillins     VITALS:  Blood pressure (!) 89/57, pulse 95, temperature 97.6 F (36.4 C), resp. rate 16, weight 52.2 kg (115 lb), SpO2 99 %.  PHYSICAL EXAMINATION:   Physical Exam limited exam patient is now full comfort care  GENERAL:  83 y.o.-year-old patient lying in the bed with no acute distress.  Thin cachectic.  Not responsive. HEENT: Head atraumatic, normocephalic. Oropharynx and nasopharynx clear.  Poor dentition LUNGS: Normal breath sounds bilaterally, no wheezing, rales, rhonchi. No use of accessory muscles of respiration.  CARDIOVASCULAR: S1, S2 normal. No murmurs, rubs, or gallops.  ABDOMEN: Soft, nontender, nondistended. Bowel sounds present. No organomegaly or mass.  EXTREMITIES: No cyanosis, clubbing or edema b/l.    NEUROLOGIC: Able to assess.  Patient is unresponsive PSYCHIATRIC:  patient is nonverbal/unresponsive   LABORATORY PANEL:  CBC Recent Labs  Lab 05/26/17 0524  WBC 15.2*  HGB 13.4  HCT 39.8  PLT 304    Chemistries  Recent Labs  Lab 05/26/17 0524  NA 140  K 3.9  CL 105  CO2 24  GLUCOSE 143*  BUN 21*  CREATININE 1.11*  CALCIUM 8.9   Cardiac Enzymes Recent Labs  Lab 05/25/17 2022  TROPONINI 2.27*   RADIOLOGY:  Dg Chest 2 View  Result Date: 05/25/2017 CLINICAL DATA:  Cough, shortness of breath and congestion for several days. EXAM: CHEST  2 VIEW COMPARISON:  04/11/2013 chest radiograph and prior studies FINDINGS: The cardiomediastinal  silhouette is unremarkable. This is a low volume film with right basilar atelectasis. There is no evidence of focal airspace disease, pulmonary edema, suspicious pulmonary nodule/mass, pleural effusion, or pneumothorax. No acute bony abnormalities are identified. IMPRESSION: Low volume film with right basilar atelectasis. Electronically Signed   By: Margarette Canada M.D.   On: 05/25/2017 19:44   ASSESSMENT AND PLAN:  Tracy Sawyer  is a 83 y.o. female with a known history of CKD and advanced dementia.   Patient was brought into the emergency room for mental mental health care after she had shown decline over last few weeks more so in the last week with no p.o. intake and worsening mentation with patient becoming unresponsive.  1.  Non-ST elevation MI, troponin level 2.27.  EKG reviewed, no acute changes.    No invasive procedures as per family request.  Patient is DNR/DNI. -Met with patient's daughter [place] healthcare power attorney and extended family who report patient has been declining overall for last 3 weeks after she had a bout of urinary tract infection.  Last week patient has not been able to eat and is been unresponsive and bedbound for long time.  She has advanced dementia.  Family wants to keep her comfort care and pain free. -I discussed with patient regarding transition to hospice home they are agreeable if a bed opens up.  Till then will keep patient here for comfort measures. Familyunderstands patient will not survive this hospitalization given her progressive decline. -We will give as needed morphine as  needed -Discontinue all oral meds. -Hospice screening  2.  Mild leukocytosis, WBC elevated at 14.3.  Unclear etiology, there are no signs of infection.  Chest x-ray and UA are negative.  3.  Advanced dementia.  Tracy Sawyer is nonverbal and bedridden at baseline.  Continue comfort care. \ 3.  CKD stable creatinine at baseline.    Poor prognosis  Case discussed with Care Management/Social  Worker. Management plans discussed with the patient, family and they are in agreement.  CODE STATUS: DNR   TOTAL TIME TAKING CARE OF THIS PATIENT: 25 minutes.  >50% time spent on counselling and coordination of care    Note: This dictation was prepared with Dragon dictation along with smaller phrase technology. Any transcriptional errors that result from this process are unintentional.  Fritzi Mandes M.D on 05/26/2017 at 10:47 AM  Between 7am to 6pm - Pager - [phone]  After 6pm go to [email or web address] - password [email or web address] [facility]  Office  [phone]  CC: Primary care physician; System, Provider Not InPatient ID: Tracy Sawyer, female   DOB: 1935-02-20, 83 y.o.   MRN: [id number]

## 2017-05-26 NOTE — H&P (Signed)
[facility] - [place] at [facility]   PATIENT NAME: Tracy Sawyer    MR#:  [id number]  DATE OF BIRTH:  [birth year]  DATE OF ADMISSION:  05/25/2017  PRIMARY CARE PHYSICIAN: System, Provider Not In   REQUESTING/REFERRING PHYSICIAN:   CHIEF COMPLAINT:   Chief Complaint  Patient presents with  . Shortness of Breath    HISTORY OF PRESENT ILLNESS: Tracy Sawyer  is a 83 y.o. female with a known history of CKD and advanced dementia.   Patient is unable to provide history due to advanced dementia, information was taken from reviewing medical records and from discussion with the patient's daughter. [name] is a nursing home resident, nonverbal and bedridden at baseline.  She is DNR/DNI per family she is not to have any invasive procedures.   She was transferred from the nursing home for increased lethargy and shortness of breath with exertion started yesterday. No cough or wheezing, no chest pain, no fever.  Her dentures were blocking the oropharynx.  Soon as they are removed, patient'S sore shortness of breath resolved. While in the emergency room, she was found with elevated troponin level at 2.27.  No acute EKG changes were noted; no infiltrates noted on the chest x-ray.  No UTI per UA.  WBC is elevated at 14.3. Patient is admitted for further evaluation and medical management.  PAST MEDICAL HISTORY:   Past Medical History:  Diagnosis Date  . Allergy   . Anemia   . Anorexia   . Chronic kidney disease   . Dementia   . Hyperlipidemia   . Hypertension   . Kidney mass   . Malnutrition related diabetes mellitus   . Memory loss   . Osteoporosis     PAST SURGICAL HISTORY:  Past Surgical History:  Procedure Laterality Date  . ABDOMINAL HYSTERECTOMY    . cataract surgery Right   . ganglion cyst resection Left   . HIP FRACTURE SURGERY    . vocal cord nodule resection    . WRIST SURGERY      SOCIAL HISTORY:  Social History   Tobacco Use  . Smoking  status: Never Smoker  . Smokeless tobacco: Never Used  Substance Use Topics  . Alcohol use: No    FAMILY HISTORY:  Family History  Problem Relation Age of Onset  . Dementia Mother   . Stroke Mother   . Diabetes Father   . Peripheral vascular disease Father   . Cancer Unknown        1 sibling  . Cancer Unknown        1 sibling    DRUG ALLERGIES:  Allergies  Allergen Reactions  . Aspirin Adult Low [Aspirin]   . Fosamax [Alendronate Sodium]   . Penicillins     REVIEW OF SYSTEMS:   Unable to obtain due to patient being nonverbal and confused.   MEDICATIONS AT HOME:  Prior to Admission medications   Medication Sig Start Date End Date Taking? Authorizing Provider  cholecalciferol (VITAMIN D) 1000 units tablet Take 2,000 Units by mouth daily.   Yes [provider]  guaiFENesin (ROBITUSSIN) 100 MG/5ML SOLN Take 5 mLs by mouth every 4 (four) hours as needed for cough or to loosen phlegm.   Yes [provider]  hydrochlorothiazide (HYDRODIURIL) 12.5 MG tablet TAKE 1 TABLET BY MOUTH EVERY DAY FOR BLOOD PRESSURE Patient taking differently: TAKE 1 TABLET BY MOUTH once a day on Monday, Wednesday and Friday 12/22/14  Yes Alba Cory, MD  memantine (NAMENDA XR) 14 MG CP24 24 hr capsule Take 14 mg by mouth daily.   Yes [provider]  Multiple Vitamin (MULTIVITAMIN) tablet Take 1 tablet by mouth daily.   Yes [provider]  polyethylene glycol (MIRALAX / GLYCOLAX) packet Take 17 g by mouth daily as needed.   Yes [provider]  amLODipine-valsartan (EXFORGE) 5-320 MG per tablet Take 1 tablet by mouth daily. Patient not taking: Reported on 05/25/2017 10/27/14   Alba Cory, MD  donepezil (ARICEPT) 10 MG tablet Take 1 tablet (10 mg total) by mouth daily. Patient not taking: Reported on 05/25/2017 07/23/14   Nilda Riggs, NP  NAMENDA XR 28 MG CP24 24 hr capsule TAKE 1 CAPSULE BY MOUTH DAILY. 07/17/15   Nilda Riggs, NP       PHYSICAL EXAMINATION:   VITAL SIGNS: Blood pressure 102/73, pulse 95, temperature 97.9 F (36.6 C), temperature source Oral, resp. rate 16, SpO2 98 %.  GENERAL:  83 y.o.-year-old patient lying in the bed with no acute distress.  EYES:  No scleral icterus.  HEENT: Head atraumatic, normocephalic.  NECK:  Supple, no jugular venous distention. No thyroid enlargement, no tenderness.  LUNGS: Reduced breath sounds bilaterally, no wheezing, rales,rhonchi or crepitation. No use of accessory muscles of respiration.  CARDIOVASCULAR: S1, S2 normal. No S3/S4..  ABDOMEN: Soft, nontender, nondistended. Bowel sounds present. No organomegaly or mass.  EXTREMITIES: No pedal edema, cyanosis, or clubbing.  NEUROLOGIC: No focal weakness. Gait not checked, patient is bedridden at baseline.  PSYCHIATRIC: She was confused and nonverbal due to advanced dementia.  SKIN: No obvious rash, lesion, or ulcer.   LABORATORY PANEL:   CBC Recent Labs  Lab 05/25/17 2022  WBC 14.3*  HGB 15.1  HCT 45.6  PLT 326  MCV 91.2  MCH 30.1  MCHC 33.1  RDW 13.2   ------------------------------------------------------------------------------------------------------------------  Chemistries  Recent Labs  Lab 05/25/17 2022  NA 140  K 4.4  CL 102  CO2 28  GLUCOSE 142*  BUN 18  CREATININE 1.26*  CALCIUM 9.5   ------------------------------------------------------------------------------------------------------------------ CrCl cannot be calculated (Unknown ideal weight.). ------------------------------------------------------------------------------------------------------------------ No results for input(s): TSH, T4TOTAL, T3FREE, THYROIDAB in the last 72 hours.  Invalid input(s): FREET3   Coagulation profile No results for input(s): INR, PROTIME in the last 168 hours. ------------------------------------------------------------------------------------------------------------------- No results for input(s):  DDIMER in the last 72 hours. -------------------------------------------------------------------------------------------------------------------  Cardiac Enzymes Recent Labs  Lab 05/25/17 2022  TROPONINI 2.27*   ------------------------------------------------------------------------------------------------------------------ Invalid input(s): POCBNP  ---------------------------------------------------------------------------------------------------------------  Urinalysis    Component Value Date/Time   COLORURINE AMBER (A) 05/25/2017 2022   APPEARANCEUR CLEAR (A) 05/25/2017 2022   APPEARANCEUR Clear 05/19/2014 0114   LABSPEC 1.025 05/25/2017 2022   LABSPEC 1.015 05/19/2014 0114   PHURINE 6.0 05/25/2017 2022   GLUCOSEU NEGATIVE 05/25/2017 2022   GLUCOSEU Negative 05/19/2014 0114   HGBUR NEGATIVE 05/25/2017 2022   BILIRUBINUR NEGATIVE 05/25/2017 2022   BILIRUBINUR Negative 05/19/2014 0114   KETONESUR NEGATIVE 05/25/2017 2022   PROTEINUR 30 (A) 05/25/2017 2022   NITRITE NEGATIVE 05/25/2017 2022   LEUKOCYTESUR NEGATIVE 05/25/2017 2022   LEUKOCYTESUR Negative 05/19/2014 0114     RADIOLOGY: Dg Chest 2 View  Result Date: 05/25/2017 CLINICAL DATA:  Cough, shortness of breath and congestion for several days. EXAM: CHEST  2 VIEW COMPARISON:  04/11/2013 chest radiograph and prior studies FINDINGS: The cardiomediastinal silhouette is unremarkable. This is a low volume film with right basilar atelectasis. There is no evidence of focal airspace disease, pulmonary edema,  suspicious pulmonary nodule/mass, pleural effusion, or pneumothorax. No acute bony abnormalities are identified. IMPRESSION: Low volume film with right basilar atelectasis. Electronically Signed   By: Harmon PierJeffrey  Hu M.D.   On: 05/25/2017 19:44    EKG: Orders placed or performed during the hospital encounter of 05/25/17  . ED EKG  . ED EKG  . EKG 12-Lead  . EKG 12-Lead    IMPRESSION AND PLAN:  1.  Non-ST elevation MI,  troponin level 2.27.  EKG reviewed, no acute changes.  Continue medical management.  No invasive procedures as per family request.  Patient is DNR/DNI. 2.  Mild leukocytosis, WBC elevated at 14.3.  Unclear etiology, there are no signs of infection.  Chest x-ray and UA are negative.  We will continue to monitor clinically closely and repeat CBC in the morning. 3.  Advanced dementia.  Evette CristalShin is nonverbal and bedridden at baseline.  Continue comfort care. 4.  CKD stable creatinine at baseline.  Continue to monitor closely and avoid nephrotoxic medications.  All the records are reviewed and case discussed with ED provider. Management plans discussed with the patient, family and they are in agreement.  CODE STATUS:    Code Status Orders  (From admission, onward)        Start     Ordered   05/26/17 0130  Do not attempt resuscitation (DNR)  Continuous    Question Answer Comment  In the event of cardiac or respiratory ARREST Do not call a "code blue"   In the event of cardiac or respiratory ARREST Do not perform Intubation, CPR, defibrillation or ACLS   In the event of cardiac or respiratory ARREST Use medication by any route, position, wound care, and other measures to relive pain and suffering. May use oxygen, suction and manual treatment of airway obstruction as needed for comfort.      05/26/17 0129    Code Status History    Date Active Date Inactive Code Status Order ID Comments User Context   This patient has a current code status but no historical code status.       TOTAL TIME TAKING CARE OF THIS PATIENT: 35 minutes.    Cammy CopaAngela Rona Tomson M.D on 05/26/2017 at 4:10 AM  Between 7am to 6pm - Pager - [phone]  After 6pm go to [email or web address] - password [email or web address]  [facility]  Hospitalists  Office  [phone]  CC: Primary care physician; System, Provider Not In

## 2017-05-26 NOTE — Plan of Care (Signed)
  Not Progressing Education: Knowledge of General Education information will improve 05/26/2017 0332 - Not Progressing by Kerrie BuffaloBakare, Purvi Ruehl Muili, RN Health Behavior/Discharge Planning: Ability to manage health-related needs will improve 05/26/2017 0332 - Not Progressing by Kerrie BuffaloBakare, Joeleen Wortley Muili, RN Clinical Measurements: Ability to maintain clinical measurements within normal limits will improve 05/26/2017 0332 - Not Progressing by Kerrie BuffaloBakare, Ivis Henneman Muili, RN Will remain free from infection 05/26/2017 0332 - Not Progressing by Kerrie BuffaloBakare, Audianna Landgren Muili, RN Diagnostic test results will improve 05/26/2017 0332 - Not Progressing by Kerrie BuffaloBakare, Yovana Scogin Muili, RN Respiratory complications will improve 05/26/2017 0332 - Not Progressing by Kerrie BuffaloBakare, Thorin Starner Muili, RN Cardiovascular complication will be avoided 05/26/2017 [id number] - Not Progressing by Kerrie BuffaloBakare, Toria Monte Muili, RN Activity: Risk for activity intolerance will decrease 05/26/2017 0332 - Not Progressing by Kerrie BuffaloBakare, Marleah Beever Muili, RN Nutrition: Adequate nutrition will be maintained 05/26/2017 0332 - Not Progressing by Kerrie BuffaloBakare, Demetrion Wesby Muili, RN Coping: Level of anxiety will decrease 05/26/2017 0332 - Not Progressing by Kerrie BuffaloBakare, Embry Huss Muili, RN Elimination: Will not experience complications related to bowel motility 05/26/2017 0332 - Not Progressing by Kerrie BuffaloBakare, Sophy Mesler Muili, RN Will not experience complications related to urinary retention 05/26/2017 0332 - Not Progressing by Kerrie BuffaloBakare, Atheena Spano Muili, RN Pain Managment: General experience of comfort will improve 05/26/2017 0332 - Not Progressing by Kerrie BuffaloBakare, Charlize Hathaway Muili, RN Safety: Ability to remain free from injury will improve 05/26/2017 0332 - Not Progressing by Kerrie BuffaloBakare, Delecia Vastine Muili, RN Skin Integrity: Risk for impaired skin integrity will decrease 05/26/2017 0332 - Not Progressing by Kerrie BuffaloBakare, Jordi Kamm Muili, RN

## 2017-05-26 NOTE — NC FL2 (Signed)
[place] MEDICAID FL2 LEVEL OF CARE SCREENING TOOL     IDENTIFICATION  Patient Name: Tracy DexterConnie M Gockel Birthdate: 09/21/1934 Sex: female Admission Date (Current Location): 05/25/2017  [place]   Facility and Address:  [facility], [address], [place], [place] [address]      Provider Number: [id number]  Attending Physician Name and Address:  Enedina FinnerPatel, Sona, MD  Relative Name and Phone Number:       Current Level of Care: Hospital Recommended Level of Care: Skilled Nursing Facility Prior Approval Number:    Date Approved/Denied:   PASRR Number: [id number])  Discharge Plan: SNF    Current Diagnoses: Patient Active Problem List   Diagnosis Date Noted  . NSTEMI (non-ST elevated myocardial infarction) (HCC) 05/25/2017  . Allergic rhinitis 12/27/2014  . Anemia in chronic illness 12/27/2014  . Benign essential HTN 12/27/2014  . Chronic kidney disease (CKD), stage III (moderate) (HCC) 12/27/2014  . Dementia, in, senility 12/27/2014  . Dermatitis, eczematoid 12/27/2014  . Lumbosacral spondylosis without myelopathy 12/27/2014  . Dyslipidemia 12/27/2014  . Anorexia 12/27/2014  . Protein calorie malnutrition (HCC) 12/27/2014  . Left kidney mass 12/27/2014  . Elevated platelet count 12/27/2014  . Varicose veins of lower extremities 12/27/2014  . Vitamin D deficiency 12/27/2014  . Memory loss 07/23/2014  . Closed fracture of femur, neck (HCC) 05/19/2014  . Dementia due to medical condition with behavioral disturbance 11/22/2012  . DEGENERATIVE JOINT DISEASE 06/30/2009  . LEG PAIN, CHRONIC 06/30/2009  . OSTEOPOROSIS 06/30/2009    Orientation RESPIRATION BLADDER Height & Weight     (Unresponsive)  Normal Incontinent Weight: 115 lb (52.2 kg) Height:     BEHAVIORAL SYMPTOMS/MOOD NEUROLOGICAL BOWEL NUTRITION STATUS      Incontinent Diet(comfort feeding only. )  AMBULATORY STATUS COMMUNICATION OF NEEDS Skin   Total  Care Verbally Normal                       Personal Care Assistance Level of Assistance  Bathing, Feeding, Dressing, Total care Bathing Assistance: Maximum assistance Feeding assistance: Maximum assistance Dressing Assistance: Maximum assistance Total Care Assistance: Maximum assistance   Functional Limitations Info  Sight, Hearing, Speech Sight Info: Impaired Hearing Info: Impaired Speech Info: Impaired    SPECIAL CARE FACTORS FREQUENCY                       Contractures      Additional Factors Info  Code Status, Allergies Code Status Info: (DNR ) Allergies Info: (Aspirin Adult Low Aspirin, Fosamax Alendronate Sodium, Penicillins)           Current Medications (05/26/2017):  This is the current hospital active medication list Current Facility-Administered Medications  Medication Dose Route Frequency Provider Last Rate Last Dose  . acetaminophen (TYLENOL) tablet 650 mg  650 mg Oral Q6H PRN Cammy CopaMaier, Angela, MD       Or  . acetaminophen (TYLENOL) suppository 650 mg  650 mg Rectal Q6H PRN Cammy CopaMaier, Angela, MD      . MEDLINE mouth rinse  15 mL Mouth Rinse q12n4p Enedina FinnerPatel, Sona, MD   15 mL at 05/26/17 1245  . morphine 2 MG/ML injection 0.5 mg  0.5 mg Intravenous Q3H PRN Enedina FinnerPatel, Sona, MD      . ondansetron [facility](ZOFRAN) injection 4 mg  4 mg Intravenous Q6H PRN Cammy CopaMaier, Angela, MD      . sodium chloride flush (NS) 0.9 % injection 3 mL  3  mL Intravenous Q12H Enedina Finner, MD         Discharge Medications: Please see discharge summary for a list of discharge medications.  Relevant Imaging Results:  Relevant Lab Results:   Additional Information (SSN: [id number])  Jentry Mcqueary, Darleen Crocker, LCSW

## 2017-05-26 NOTE — Progress Notes (Signed)
Patient's daughter expressed their decision to stop aggressive treatment for their mother.  All 3 are in agreement and supportive to each other.  Oral medications and IV fluids were discontinued.  Oral care and skin care continue.  Numerous family members have arrived this afternoon.  They seem very supportive of each other.  Patient is being transferred to 1A for comfort care as no bed is available at inpatient Hospice.

## 2017-05-26 NOTE — Progress Notes (Signed)
New hospice home referral received from [place]. Patient is an 83 year old woman admitted from [facility] for evaluation of shortness of breath and increased lethargy. In the ED she was found to have her dentures blocking her oropharynx. Lab results revealed an elevated troponin of 2.27 with out EKG changes as well as elevated WBC's. Patient has remained minimally responsive. Attending physician Dr. Posey Pronto spoke with family, patient at baseline was bed bound and nonverbal due to advanced dementia. Family declined any invasive procedures and wished to focus on comfort with transfer to the hospice home. Writer met with in the room patient's daughter Tracy Sawyer as well as several other family members including her daughter Tracy Sawyer. Education initiated regarding hospice services, philosophy and team approach to care with good understanding voiced. Questions answered. Consents signed. Patient seen lying in bed, eyes closed, no response to verbal stimuli or gentle touch. Mouth care provided. Audible secretions noted. Education provide to family regarding end of life symptom management. Staff RN Stanton Kidney notified of need for PRN Robinul for secretion management. Patient appeared comfortable, no increased work of breathing or nonverbal signs of pain notes. Patient information faxed to referral. Transfer is planned for tomorrow 1/5. Hospital and hospice home staff aware, family aware. Flo Shanks, RN, BSN, [place] [facility] liaison [phone].

## 2017-05-27 ENCOUNTER — Other Ambulatory Visit: Payer: Self-pay

## 2017-05-27 MED ORDER — GLYCOPYRROLATE 0.2 MG/ML IJ SOLN: 0.2000 mg | INTRAMUSCULAR | Status: AC

## 2017-05-27 MED ORDER — LORAZEPAM 2 MG/ML IJ SOLN: 1.0000 mg | INTRAMUSCULAR | 0 refills | Status: AC | PRN

## 2017-05-27 MED ORDER — MORPHINE SULFATE (CONCENTRATE) 10 MG/0.5ML PO SOLN: 10.0000 mg | ORAL | Status: DC | PRN

## 2017-05-27 MED ORDER — MORPHINE SULFATE (CONCENTRATE) 10 MG/0.5ML PO SOLN: 10.0000 mg | ORAL | Status: AC | PRN

## 2017-05-27 MED ORDER — SCOPOLAMINE 1 MG/3DAYS TD PT72
1.0000 | MEDICATED_PATCH | TRANSDERMAL | Status: DC
  Administered 2017-05-27: 10:00:00 1.5 mg via TRANSDERMAL
  Filled 2017-05-27: qty 1

## 2017-05-27 MED ORDER — ACETAMINOPHEN 650 MG RE SUPP: 650.0000 mg | Freq: Four times a day (QID) | RECTAL | 0 refills | Status: AC | PRN

## 2017-05-27 MED ORDER — MORPHINE SULFATE (PF) 2 MG/ML IV SOLN: 1.0000 mg | INTRAVENOUS | 0 refills | Status: AC | PRN

## 2017-05-27 MED ORDER — SCOPOLAMINE 1 MG/3DAYS TD PT72: 1.0000 | MEDICATED_PATCH | TRANSDERMAL | 12 refills | Status: AC

## 2017-05-27 NOTE — Discharge Summary (Signed)
[employer] - [place] at [facility]   PATIENT NAME: Tracy Sawyer    MR#:  [id number]  DATE OF BIRTH:  03-15-1935  DATE OF ADMISSION:  05/25/2017 ADMITTING PHYSICIAN: Cammy Copa, MD  DATE OF DISCHARGE to hospice home 05/27/2017  PRIMARY CARE PHYSICIAN: System, Provider Not In    ADMISSION DIAGNOSIS:  NSTEMI (non-ST elevated myocardial infarction) (HCC) [I21.4]  DISCHARGE DIAGNOSIS:  Active Problems:   NSTEMI (non-ST elevated myocardial infarction) (HCC)   SECONDARY DIAGNOSIS:   Past Medical History:  Diagnosis Date  . Allergy   . Anemia   . Anorexia   . Chronic kidney disease   . Dementia   . Hyperlipidemia   . Hypertension   . Kidney mass   . Malnutrition related diabetes mellitus   . Memory loss   . Osteoporosis     HOSPITAL COURSE:   1.  Non-ST elevation myocardial infarction.  Troponin went up to 2.27.  Family requested no invasive procedures and DO NOT RESUSCITATE. 2.  Leukocytosis 3.  Advanced dementia.  Patient is nonverbal and bedridden at baseline 4.  Chronic kidney disease stage III.  Family interested in comfort care measures.  Patient will be transferred to the hospice home today.  As needed IV morphine, IV glycopyrrolate and IV Ativan ordered.  If IV access is lost can switch over to Roxanol, p.o. Ativan and glycopyrrolate.  Since the patient does have rattling in her lungs I do not expect the patient to survive much longer.  Respiratory status currently is stable  DISCHARGE CONDITIONS:  Guarded  CONSULTS OBTAINED:  Hospice  DRUG ALLERGIES:   Allergies  Allergen Reactions  . Aspirin Adult Low [Aspirin]   . Fosamax [Alendronate Sodium]   . Penicillins     DISCHARGE MEDICATIONS:   Allergies as of 05/27/2017      Reactions   Aspirin Adult Low [aspirin]    Fosamax [alendronate Sodium]    Penicillins       Medication List    STOP taking these medications   amLODipine-valsartan 5-320 MG tablet Commonly known as:   EXFORGE   cholecalciferol 1000 units tablet Commonly known as:  VITAMIN D   donepezil 10 MG tablet Commonly known as:  ARICEPT   guaiFENesin 100 MG/5ML Soln Commonly known as:  ROBITUSSIN   hydrochlorothiazide 12.5 MG tablet Commonly known as:  HYDRODIURIL   multivitamin tablet   NAMENDA XR 14 MG Cp24 24 hr capsule Generic drug:  memantine   NAMENDA XR 28 MG Cp24 24 hr capsule Generic drug:  memantine   polyethylene glycol packet Commonly known as:  MIRALAX / GLYCOLAX     TAKE these medications   acetaminophen 650 MG suppository Commonly known as:  TYLENOL Place 1 suppository (650 mg total) rectally every 6 (six) hours as needed for mild pain (or Fever >/= 101).   glycopyrrolate 0.2 MG/ML injection Commonly known as:  ROBINUL Inject 1 mL (0.2 mg total) into the vein every 4 (four) hours.   LORazepam 2 MG/ML injection Commonly known as:  ATIVAN Inject 0.5 mLs (1 mg total) into the vein every 4 (four) hours as needed for anxiety (shortness of breath).   morphine 2 MG/ML injection Inject 0.5-1 mLs (1-2 mg total) into the vein every hour as needed.   morphine CONCENTRATE 10 MG/0.5ML Soln concentrated solution Take 0.5 mLs (10 mg total) by mouth every hour as needed for severe pain or shortness of breath.   scopolamine 1 MG/3DAYS Commonly known as:  TRANSDERM-SCOP Place 1  patch (1.5 mg total) onto the skin every 3 (three) days.        DISCHARGE INSTRUCTIONS:   Follow-up with hospice home team on the day of discharge  If you experience worsening of your admission symptoms, develop shortness of breath, life threatening emergency, suicidal or homicidal thoughts you must seek medical attention immediately by calling 911 or calling your MD immediately  if symptoms less severe.  You Must read complete instructions/literature along with all the possible adverse reactions/side effects for all the Medicines you take and that have been prescribed to you. Take any new  Medicines after you have completely understood and accept all the possible adverse reactions/side effects.   Please note  You were cared for by a hospitalist during your hospital stay. If you have any questions about your discharge medications or the care you received while you were in the hospital after you are discharged, you can call the unit and asked to speak with the hospitalist on call if the hospitalist that took care of you is not available. Once you are discharged, your primary care physician will handle any further medical issues. Please note that NO REFILLS for any discharge medications will be authorized once you are discharged, as it is imperative that you return to your primary care physician (or establish a relationship with a primary care physician if you do not have one) for your aftercare needs so that they can reassess your need for medications and monitor your lab values.    Today   CHIEF COMPLAINT:   Chief Complaint  Patient presents with  . Shortness of Breath    HISTORY OF PRESENT ILLNESS:  Tracy Sawyer  is a 83 y.o. female with a known history of dementia presented with shortness of breath   VITAL SIGNS:  Blood pressure 132/80, pulse (!) 111, temperature 98.2 F (36.8 C), temperature source Oral, resp. rate 16, weight 52.2 kg (115 lb), SpO2 100 %.   PHYSICAL EXAMINATION:  GENERAL:  83 y.o.-year-old patient lying in the bed with no acute distress.  EYES: Pupils equal, round, reactive to light and accommodation.  Patient resisted me opening her eyes. HEENT: Head atraumatic, normocephalic. Oropharynx dry. NECK:  Supple, no jugular venous distention. No thyroid enlargement, no tenderness.  LUNGS: Decreased breath sounds bilaterally, positive rhonchi throughout lung field. No use of accessory muscles of respiration.  CARDIOVASCULAR: S1, S2 tachycardic. No murmurs, rubs, or gallops.  ABDOMEN: Soft, non-tender, non-distended. Bowel sounds present. No organomegaly  or mass.  EXTREMITIES: No pedal edema, cyanosis, or clubbing.  NEUROLOGIC: Patient was able to squeeze my hand with her left hand and wiggle her toes on her left foot.  I did not get a response on the right side. PSYCHIATRIC: The patient is lethargic but opens eyes to my voice.  And follow some simple commands with her left side. SKIN: No obvious rash, lesion, or ulcer anteriorly  DATA REVIEW:   CBC Recent Labs  Lab 05/26/17 0524  WBC 15.2*  HGB 13.4  HCT 39.8  PLT 304    Chemistries  Recent Labs  Lab 05/26/17 0524  NA 140  K 3.9  CL 105  CO2 24  GLUCOSE 143*  BUN 21*  CREATININE 1.11*  CALCIUM 8.9    Cardiac Enzymes Recent Labs  Lab 05/25/17 2022  TROPONINI 2.27*    Microbiology Results  Results for orders placed or performed during the hospital encounter of 05/25/17  MRSA PCR Screening     Status: None  Collection Time: 05/26/17  2:06 AM  Result Value Ref Range Status   MRSA by PCR NEGATIVE NEGATIVE Final    Comment:        The GeneXpert MRSA Assay (FDA approved for NASAL specimens only), is one component of a comprehensive MRSA colonization surveillance program. It is not intended to diagnose MRSA infection nor to guide or monitor treatment for MRSA infections. Performed at [facility], [address] [place], [place] [address]     RADIOLOGY:  Dg Chest 2 View  Result Date: 05/25/2017 CLINICAL DATA:  Cough, shortness of breath and congestion for several days. EXAM: CHEST  2 VIEW COMPARISON:  04/11/2013 chest radiograph and prior studies FINDINGS: The cardiomediastinal silhouette is unremarkable. This is a low volume film with right basilar atelectasis. There is no evidence of focal airspace disease, pulmonary edema, suspicious pulmonary nodule/mass, pleural effusion, or pneumothorax. No acute bony abnormalities are identified. IMPRESSION: Low volume film with right basilar atelectasis. Electronically Signed   By: Harmon PierJeffrey  Hu M.D.   On:  05/25/2017 19:44    Management plans discussed with the patient, family and they are in agreement for transfer to the hospice home.  CODE STATUS:     Code Status Orders  (From admission, onward)        Start     Ordered   05/26/17 0130  Do not attempt resuscitation (DNR)  Continuous    Question Answer Comment  In the event of cardiac or respiratory ARREST Do not call a "code blue"   In the event of cardiac or respiratory ARREST Do not perform Intubation, CPR, defibrillation or ACLS   In the event of cardiac or respiratory ARREST Use medication by any route, position, wound care, and other measures to relive pain and suffering. May use oxygen, suction and manual treatment of airway obstruction as needed for comfort.      05/26/17 0129    Code Status History    Date Active Date Inactive Code Status Order ID Comments User Context   This patient has a current code status but no historical code status.      TOTAL TIME TAKING CARE OF THIS PATIENT: 35 minutes.    Alford Highlandichard Danajah Birdsell M.D on 05/27/2017 at 8:03 AM  Between 7am to 6pm - Pager - [phone]  After 6pm go to [email or web address] - password [email or web address]  [employer] Office  [phone]  CC: Primary care physician; System, Provider Not In

## 2017-05-27 NOTE — Progress Notes (Signed)
Pt family in room. Explained D/C and transport to them. [facility]. IV left in place per Hospice. Changed pt into transport package. Suctioned pt due to excess oral secretions and inability to swallow. Family to follow EMS to Hospice.

## 2017-05-27 NOTE — Clinical Social Work Note (Addendum)
CSW waiting for call back from representative from [facility] to determine discharge timing for the patient today. CSW will deliver packet when confirmation from the facility is received.  CSW received confirmation from Tracy Sawyer from [facility] that the patient will discharge this afternoon once a room is prepared. CSW is waiting for Tracy Sawyer to indicate timing prior to delivery of the packet. The [facility]  Tracy Sawyer has confirmed acceptance of the patient with EMS to pick up at 1:30 pm. The discharge packet has been delivered including the DNR, HCPOA form, and discharge information. The CSW has updated the attending RN verbally with the need for report to be called as soon as possible. CSW is signing off. Please consult should additional needs arise.  Tracy PonderKaren Martha Cinnamon Sawyer, MSW, Theresia MajorsLCSWA [phone]

## 2017-06-23 DEATH — deceased
